# Patient Record
Sex: Male | Born: 1965 | Race: White | Hispanic: No | Marital: Single | Smoking: Never smoker
Health system: Southern US, Community
[De-identification: ages and names within clinical notes are randomized; demographics above are authoritative.]

## PROBLEM LIST (undated history)

## (undated) DIAGNOSIS — K219 Gastro-esophageal reflux disease without esophagitis: Secondary | ICD-10-CM

---

## 2016-07-23 ENCOUNTER — Emergency Department
Admission: EM | Admit: 2016-07-23 | Discharge: 2016-07-23 | Disposition: A | Discharge status: Home / Self Care | Payer: BLUE CROSS/BLUE SHIELD | Source: Home / Self Care | Attending: Family Medicine | Admitting: Family Medicine

## 2016-07-23 ENCOUNTER — Encounter: Payer: Self-pay | Admitting: Emergency Medicine

## 2016-07-23 DIAGNOSIS — B9789 Other viral agents as the cause of diseases classified elsewhere: Secondary | ICD-10-CM | POA: Diagnosis not present

## 2016-07-23 DIAGNOSIS — J069 Acute upper respiratory infection, unspecified: Secondary | ICD-10-CM

## 2016-07-23 MED ORDER — GUAIFENESIN-CODEINE 100-10 MG/5ML PO SOLN: ORAL | 0 refills | Status: DC

## 2016-07-23 MED ORDER — AZITHROMYCIN 250 MG PO TABS: ORAL_TABLET | ORAL | 0 refills | Status: DC

## 2016-07-23 NOTE — ED Triage Notes (Signed)
Pt c/o cough and ear fullness that started x2 days ago. Denies fever. He is taking mucinex.

## 2016-07-23 NOTE — Discharge Instructions (Signed)
Take plain guaifenesin (1200mg extended release tabs such as Mucinex) twice daily, with plenty of water, for cough and congestion.  May add Pseudoephedrine (30mg, one or two every 4 to 6 hours) for sinus congestion.  Get adequate rest.   °May use Afrin nasal spray (or generic oxymetazoline) twice daily for about 5 days and then discontinue.  Also recommend using saline nasal spray several times daily and saline nasal irrigation (AYR is a common brand).  Use Flonase nasal spray each morning after using Afrin nasal spray and saline nasal irrigation. °Try warm salt water gargles for sore throat.  °Stop all antihistamines for now, and other non-prescription cough/cold preparations. °May take Ibuprofen 200mg, 4 tabs every 8 hours with food for body aches, headache, etc. °Begin Azithromycin if not improving about one week or if persistent fever develops  °Follow-up with family doctor if not improving about10 days.  °

## 2016-07-23 NOTE — ED Provider Notes (Signed)
[facility]    CSN: [id number] Arrival date & time: 07/23/16  1615     History   Chief Complaint Chief Complaint  Patient presents with  . Cough    HPI Isaac Little is a 51 y.o. male.   Patient complains of three day history of typical cold-like symptoms including mild sore throat, sinus congestion, fatigue, and cough.    The history is provided by the patient.    History reviewed. No pertinent past medical history.  There are no active problems to display for this patient.   History reviewed. No pertinent surgical history.     Home Medications    Prior to Admission medications   Medication Sig Start Date End Date Taking? Authorizing Provider  azithromycin (ZITHROMAX Z-PAK) 250 MG tablet Take 2 tabs today; then begin one tab once daily for 4 more days (Rx void after 07/31/16) 07/23/16   Lattie HawStephen A Beese, MD  guaiFENesin-codeine 100-10 MG/5ML syrup Take 10mL by mouth at bedtime as needed for cough 07/23/16   Lattie HawStephen A Beese, MD    Family History History reviewed. No pertinent family history.  Social History Social History  Substance Use Topics  . Smoking status: Never Smoker  . Smokeless tobacco: Never Used  . Alcohol use No     Allergies   Patient has no allergy information on record.   Review of Systems Review of Systems + sore throat + cough No pleuritic pain No wheezing + nasal congestion + post-nasal drainage No sinus pain/pressure No itchy/red eyes ? earache No hemoptysis No SOB No fever, + chills No nausea No vomiting No abdominal pain No diarrhea No urinary symptoms No skin rash + fatigue No myalgias No headache Used OTC meds without relief   Physical Exam Triage Vital Signs ED Triage Vitals  Enc Vitals Group     BP 07/23/16 1639 119/79     Pulse Rate 07/23/16 1639 76     Resp --      Temp 07/23/16 1639 98.5 F (36.9 C)     Temp Source 07/23/16 1639 Oral     SpO2 07/23/16 1639 99 %     Weight 07/23/16  1640 207 lb (93.9 kg)     Height --      Head Circumference --      Peak Flow --      Pain Score 07/23/16 1641 0     Pain Loc --      Pain Edu? --      Excl. in GC? --    No data found.   Updated Vital Signs BP 119/79 (BP Location: Right Arm)   Pulse 76   Temp 98.5 F (36.9 C) (Oral)   Wt 207 lb (93.9 kg)   SpO2 99%   Visual Acuity Right Eye Distance:   Left Eye Distance:   Bilateral Distance:    Right Eye Near:   Left Eye Near:    Bilateral Near:     Physical Exam Nursing notes and Vital Signs reviewed. Appearance:  Patient appears stated age, and in no acute distress Eyes:  Pupils are equal, round, and reactive to light and accomodation.  Extraocular movement is intact.  Conjunctivae are not inflamed  Ears:  Canals normal.  Tympanic membranes normal.  Nose:  Mildly congested turbinates.  No sinus tenderness.   Pharynx:  Normal Neck:  Supple.  Tender enlarged posterior/lateral nodes are palpated bilaterally  Lungs:  Clear to auscultation.  Breath sounds are equal.  Moving air  well. Heart:  Regular rate and rhythm without murmurs, rubs, or gallops.  Abdomen:  Nontender without masses or hepatosplenomegaly.  Bowel sounds are present.  No CVA or flank tenderness.  Extremities:  No edema.  Skin:  No rash present.    UC Treatments / Results  Labs (all labs ordered are listed, but only abnormal results are displayed) Labs Reviewed - No data to display  EKG  EKG Interpretation None       Radiology No results found.  Procedures Procedures (including critical care time)  Medications Ordered in UC Medications - No data to display   Initial Impression / Assessment and Plan / UC Course  I have reviewed the triage vital signs and the nursing notes.  Pertinent labs & imaging results that were available during my care of the patient were reviewed by me and considered in my medical decision making (see chart for details).    There is no evidence of bacterial  infection today.   Treat symptomatically for now  Rx for Robitussin AC for night time cough.  Take plain guaifenesin (1200mg  extended release tabs such as Mucinex) twice daily, with plenty of water, for cough and congestion.  May add Pseudoephedrine (30mg , one or two every 4 to 6 hours) for sinus congestion.  Get adequate rest.   May use Afrin nasal spray (or generic oxymetazoline) twice daily for about 5 days and then discontinue.  Also recommend using saline nasal spray several times daily and saline nasal irrigation (AYR is a common brand).  Use Flonase nasal spray each morning after using Afrin nasal spray and saline nasal irrigation. Try warm salt water gargles for sore throat.  Stop all antihistamines for now, and other non-prescription cough/cold preparations. May take Ibuprofen 200mg , 4 tabs every 8 hours with food for body aches, headache, etc. Begin Azithromycin if not improving about one week or if persistent fever develops (Given a prescription to hold, with an expiration date)  Follow-up with family doctor if not improving about10 days.     Final Clinical Impressions(s) / UC Diagnoses   Final diagnoses:  Viral URI with cough    New Prescriptions New Prescriptions   AZITHROMYCIN (ZITHROMAX Z-PAK) 250 MG TABLET    Take 2 tabs today; then begin one tab once daily for 4 more days (Rx void after 07/31/16)   GUAIFENESIN-CODEINE 100-10 MG/5ML SYRUP    Take 10mL by mouth at bedtime as needed for cough     Lattie Haw, MD 07/23/16 [phone]

## 2018-04-28 ENCOUNTER — Encounter: Payer: Self-pay | Admitting: Emergency Medicine

## 2018-04-28 ENCOUNTER — Emergency Department
Admission: EM | Admit: 2018-04-28 | Discharge: 2018-04-28 | Disposition: A | Discharge status: Home / Self Care | Payer: BLUE CROSS/BLUE SHIELD | Source: Home / Self Care | Attending: Family Medicine | Admitting: Family Medicine

## 2018-04-28 DIAGNOSIS — J209 Acute bronchitis, unspecified: Secondary | ICD-10-CM | POA: Diagnosis not present

## 2018-04-28 MED ORDER — AZITHROMYCIN 250 MG PO TABS: ORAL_TABLET | ORAL | 0 refills | Status: DC

## 2018-04-28 NOTE — Discharge Instructions (Addendum)
Take plain guaifenesin (1200mg extended release tabs such as Mucinex) twice daily, with plenty of water, for cough and congestion.  May add Pseudoephedrine (30mg, one or two every 4 to 6 hours) for sinus congestion.  Get adequate rest.   °May use Afrin nasal spray (or generic oxymetazoline) each morning for about 5 days and then discontinue.  Also recommend using saline nasal spray several times daily and saline nasal irrigation (AYR is a common brand).  Use Flonase nasal spray each morning after using Afrin nasal spray and saline nasal irrigation. °Try warm salt water gargles for sore throat.  °Stop all antihistamines for now, and other non-prescription cough/cold preparations. °May take Ibuprofen 200mg, 4 tabs every 8 hours with food for body aches, fever, etc. °May take Delsym Cough Suppressant at bedtime for nighttime cough.  °

## 2018-04-28 NOTE — ED Triage Notes (Signed)
Pt c/o cough and chest congestion x1 week. Taking mucinex.

## 2018-04-28 NOTE — ED Provider Notes (Signed)
[facility]    CSN: [id number] Arrival date & time: 04/28/18  1344     History   Chief Complaint Chief Complaint  Patient presents with  . Cough    HPI Isaac Cookeyimothy Barua is a 52 y.o. male.   About 2 weeks ago patient developed typical cold-like symptoms developing over several days, including mild sore throat, sinus congestion, fatigue, and cough.  His cough has persisted and he now feels tight in his anterior chest  The history is provided by the patient.    History reviewed. No pertinent past medical history.  There are no active problems to display for this patient.   History reviewed. No pertinent surgical history.     Home Medications    Prior to Admission medications   Medication Sig Start Date End Date Taking? Authorizing Provider  azithromycin (ZITHROMAX Z-PAK) 250 MG tablet Take 2 tabs today; then begin one tab once daily for 4 more days (Rx void after 07/31/16) 04/28/18   Lattie HawBeese, Soley Harriss A, MD  guaiFENesin-codeine 100-10 MG/5ML syrup Take 10mL by mouth at bedtime as needed for cough 07/23/16   Lattie HawBeese, Dawsyn Zurn A, MD    Family History History reviewed. No pertinent family history.  Social History Social History   Tobacco Use  . Smoking status: Never Smoker  . Smokeless tobacco: Never Used  Substance Use Topics  . Alcohol use: No  . Drug use: Not on file     Allergies   Patient has no allergy information on record.   Review of Systems Review of Systems + sore throat + cough No pleuritic pain, but feels tight in anterior chest No wheezing + nasal congestion + post-nasal drainage No sinus pain/pressure No itchy/red eyes ? earache No hemoptysis + SOB No fever, + chills No nausea No vomiting No abdominal pain No diarrhea No urinary symptoms No skin rash + fatigue No myalgias No headache Used OTC meds without relief   Physical Exam Triage Vital Signs ED Triage Vitals  Enc Vitals Group     BP 04/28/18 1409 132/82   Pulse Rate 04/28/18 1409 70     Resp --      Temp 04/28/18 1409 97.6 F (36.4 C)     Temp Source 04/28/18 1409 Oral     SpO2 04/28/18 1409 98 %     Weight 04/28/18 1410 215 lb (97.5 kg)     Height --      Head Circumference --      Peak Flow --      Pain Score 04/28/18 1410 0     Pain Loc --      Pain Edu? --      Excl. in GC? --    No data found.  Updated Vital Signs BP 132/82 (BP Location: Right Arm)   Pulse 70   Temp 97.6 F (36.4 C) (Oral)   Wt 97.5 kg   SpO2 98%   Visual Acuity Right Eye Distance:   Left Eye Distance:   Bilateral Distance:    Right Eye Near:   Left Eye Near:    Bilateral Near:     Physical Exam Nursing notes and Vital Signs reviewed. Appearance:  Patient appears stated age, and in no acute distress Eyes:  Pupils are equal, round, and reactive to light and accomodation.  Extraocular movement is intact.  Conjunctivae are not inflamed  Ears:  Canals normal.  Tympanic membranes normal.  Nose:  Congested turbinates.  No sinus tenderness.   Pharynx:  Normal  Neck:  Supple.  Enlarged nontender posterior/lateral nodes are palpated bilaterally.  Lungs:  Clear to auscultation.  Breath sounds are equal.  Moving air well. Heart:  Regular rate and rhythm without murmurs, rubs, or gallops.  Abdomen:  Nontender without masses or hepatosplenomegaly.  Bowel sounds are present.  No CVA or flank tenderness.  Extremities:  No edema.  Skin:  No rash present.    UC Treatments / Results  Labs (all labs ordered are listed, but only abnormal results are displayed) Labs Reviewed - No data to display  EKG None  Radiology No results found.  Procedures Procedures (including critical care time)  Medications Ordered in UC Medications - No data to display  Initial Impression / Assessment and Plan / UC Course  I have reviewed the triage vital signs and the nursing notes.  Pertinent labs & imaging results that were available during my care of the patient were  reviewed by me and considered in my medical decision making (see chart for details).    Begin Z-pak. Followup with Family Doctor if not improved in one week.    Final Clinical Impressions(s) / UC Diagnoses   Final diagnoses:  Acute bronchitis, unspecified organism     Discharge Instructions     Take plain guaifenesin (1200mg  extended release tabs such as Mucinex) twice daily, with plenty of water, for cough and congestion.  May add Pseudoephedrine (30mg , one or two every 4 to 6 hours) for sinus congestion.  Get adequate rest.   May use Afrin nasal spray (or generic oxymetazoline) each morning for about 5 days and then discontinue.  Also recommend using saline nasal spray several times daily and saline nasal irrigation (AYR is a common brand).  Use Flonase nasal spray each morning after using Afrin nasal spray and saline nasal irrigation. Try warm salt water gargles for sore throat.  Stop all antihistamines for now, and other non-prescription cough/cold preparations. May take Ibuprofen 200mg , 4 tabs every 8 hours with food for body aches, fever, etc. May take Delsym Cough Suppressant at bedtime for nighttime cough.           ED Prescriptions    Medication Sig Dispense Auth. Provider   azithromycin (ZITHROMAX Z-PAK) 250 MG tablet Take 2 tabs today; then begin one tab once daily for 4 more days (Rx void after 07/31/16) 6 tablet Lattie Haw, MD        Lattie Haw, MD 05/01/18 1950

## 2018-05-17 ENCOUNTER — Emergency Department
Admission: EM | Admit: 2018-05-17 | Discharge: 2018-05-17 | Disposition: A | Discharge status: Home / Self Care | Payer: BLUE CROSS/BLUE SHIELD | Source: Home / Self Care | Attending: Emergency Medicine | Admitting: Emergency Medicine

## 2018-05-17 ENCOUNTER — Other Ambulatory Visit: Payer: Self-pay

## 2018-05-17 ENCOUNTER — Encounter: Payer: Self-pay | Admitting: *Deleted

## 2018-05-17 DIAGNOSIS — J209 Acute bronchitis, unspecified: Secondary | ICD-10-CM

## 2018-05-17 DIAGNOSIS — R059 Cough, unspecified: Secondary | ICD-10-CM

## 2018-05-17 DIAGNOSIS — R05 Cough: Secondary | ICD-10-CM

## 2018-05-17 MED ORDER — METHYLPREDNISOLONE ACETATE 80 MG/ML IJ SUSP
80.0000 mg | Freq: Once | INTRAMUSCULAR | Status: AC
  Administered 2018-05-17: 80 mg via INTRAMUSCULAR

## 2018-05-17 MED ORDER — PREDNISONE 20 MG PO TABS: 60.0000 mg | ORAL_TABLET | Freq: Every day | ORAL | 0 refills | Status: AC

## 2018-05-17 NOTE — ED Provider Notes (Signed)
[facility]    CSN: [id number] Arrival date & time: 05/17/18  1120     History   Chief Complaint Chief Complaint  Patient presents with  . Cough    HPI Charvis Lightner is a 52 y.o. male.   HPI Onset about 3 weeks ago of cough productive of discolored sputum and fever, was treated with Z-Pak 2 weeks ago and he felt as if he was feeling better without any fever or discoloration of sputum, but the cough persisted.  Then, for the past 2 or 3 nights he has had worse hacking, nonproductive cough without chest pain or shortness of breath or wheezing.  No fever.  No history of asthma or chronic lung disease.  He does not smoke, but he was exposed to some secondhand smoke over Thanksgiving. History reviewed. No pertinent past medical history.  There are no active problems to display for this patient.   History reviewed. No pertinent surgical history.     Home Medications    Prior to Admission medications   Medication Sig Start Date End Date Taking? Authorizing Provider  predniSONE (DELTASONE) 20 MG tablet Take 3 tablets (60 mg total) by mouth daily for 5 days. 05/17/18 05/22/18  Lajean Manes, MD    Family History History reviewed. No pertinent family history.  Social History Social History   Tobacco Use  . Smoking status: Never Smoker  . Smokeless tobacco: Never Used  Substance Use Topics  . Alcohol use: No  . Drug use: Not on file     Allergies   Patient has no known allergies.   Review of Systems Review of Systems  All other systems reviewed and are negative.    Physical Exam Triage Vital Signs ED Triage Vitals  Enc Vitals Group     BP 05/17/18 1141 116/79     Pulse Rate 05/17/18 1141 75     Resp 05/17/18 1141 18     Temp 05/17/18 1141 98.4 F (36.9 C)     Temp Source 05/17/18 1141 Oral     SpO2 05/17/18 1141 99 %     Weight 05/17/18 1142 220 lb (99.8 kg)     Height 05/17/18 1142 5\' 11"  (1.803 m)     Head Circumference --      Peak  Flow --      Pain Score 05/17/18 1142 0     Pain Loc --      Pain Edu? --      Excl. in GC? --    No data found.  Updated Vital Signs BP 116/79 (BP Location: Right Arm)   Pulse 75   Temp 98.4 F (36.9 C) (Oral)   Resp 18   Ht 5\' 11"  (1.803 m)   Wt 99.8 kg   SpO2 99%   BMI 30.68 kg/m   Visual Acuity Right Eye Distance:   Left Eye Distance:   Bilateral Distance:    Right Eye Near:   Left Eye Near:    Bilateral Near:     Physical Exam  Constitutional: He is oriented to person, place, and time. He appears well-developed and well-nourished. No distress.  Severe hacking cough noted at times  HENT:  Head: Normocephalic and atraumatic.  Right Ear: Tympanic membrane normal.  Left Ear: Tympanic membrane normal.  Nose: Nose normal.  Mouth/Throat: Oropharynx is clear and moist. No oropharyngeal exudate.  Eyes: Right eye exhibits no discharge. Left eye exhibits no discharge. No scleral icterus.  Neck: Neck supple.  Cardiovascular: Normal  rate, regular rhythm and normal heart sounds.  Pulmonary/Chest: No respiratory distress. He has no wheezes. He has rhonchi (Mild, diffuse, clears after coughing). He has no rales.  Other than scattered rhonchi, his lungs are clear.   Lymphadenopathy:    He has no cervical adenopathy.  Neurological: He is alert and oriented to person, place, and time.  Skin: Skin is warm and dry.  Nursing note and vitals reviewed.    UC Treatments / Results  Labs (all labs ordered are listed, but only abnormal results are displayed) Labs Reviewed - No data to display  EKG None  Radiology No results found.  Procedures Procedures (including critical care time)  Medications Ordered in UC Medications  methylPREDNISolone acetate (DEPO-MEDROL) injection 80 mg (80 mg Intramuscular Given 05/17/18 1206)    Initial Impression / Assessment and Plan / UC Course  I have reviewed the triage vital signs and the nursing notes.  Pertinent labs & imaging  results that were available during my care of the patient were reviewed by me and considered in my medical decision making (see chart for details).     Likely has post bronchitic cough.  Was treated for bacterial bronchitis 2 weeks ago and bacterial bronchitis symptoms resolved, but his residual symptom is severe cough. Clinically, no evidence of wheezing and no indication for chest x-ray as his lungs are otherwise clear other than few scattered rhonchi. After treatment options discussed he agrees with the following plans as described in the discharge instructions. Depo-Medrol 80 mg IM stat. Oral prednisone burst. Other symptomatic care discussed.  He declined any prescription cough med Final Clinical Impressions(s) / UC Diagnoses   Final diagnoses:  Cough  Acute bronchitis     Discharge Instructions     Your cough is likely from leftover inflammation from the bronchitis that started 2 weeks ago.  You have already been treated with antibiotic, and there is no sign of any bacterial infection at this time, so no antibiotic indicated. Listening to your lungs, there is no sign of pneumonia, so chest x-ray not needed. Because the cough has been severe, were aggressively treating the inflammation with Depo-Medrol 80 mg IM shot, and I have sent a prescription for prednisone to your pharmacy to take for 5 days. Follow-up with your PCP if not improved in 7 days, but return to urgent care or emergency room if any severe worsening symptoms.     ED Prescriptions    Medication Sig Dispense Auth. Provider   predniSONE (DELTASONE) 20 MG tablet Take 3 tablets (60 mg total) by mouth daily for 5 days. 15 tablet Lajean ManesMassey, David, MD     Controlled Substance Prescriptions [place] Controlled Substance Registry consulted? Not Applicable   Lajean ManesMassey, David, MD 05/17/18 1217

## 2018-05-17 NOTE — ED Triage Notes (Signed)
Pt c/o cough x 3-4 wks. He took Zpak and mucinex, felt some better then cough began worse again. Denies fever.

## 2018-05-17 NOTE — Discharge Instructions (Addendum)
Your cough is likely from leftover inflammation from the bronchitis that started 2 weeks ago.  You have already been treated with antibiotic, and there is no sign of any bacterial infection at this time, so no antibiotic indicated. Listening to your lungs, there is no sign of pneumonia, so chest x-ray not needed. Because the cough has been severe, were aggressively treating the inflammation with Depo-Medrol 80 mg IM shot, and I have sent a prescription for prednisone to your pharmacy to take for 5 days. Follow-up with your PCP if not improved in 7 days, but return to urgent care or emergency room if any severe worsening symptoms.

## 2018-10-07 ENCOUNTER — Emergency Department
Admission: EM | Admit: 2018-10-07 | Discharge: 2018-10-07 | Disposition: A | Discharge status: Home / Self Care | Payer: BLUE CROSS/BLUE SHIELD | Source: Home / Self Care

## 2018-10-07 ENCOUNTER — Other Ambulatory Visit: Payer: Self-pay

## 2018-10-07 DIAGNOSIS — J309 Allergic rhinitis, unspecified: Secondary | ICD-10-CM | POA: Diagnosis not present

## 2018-10-07 DIAGNOSIS — J019 Acute sinusitis, unspecified: Secondary | ICD-10-CM

## 2018-10-07 MED ORDER — FLUTICASONE PROPIONATE 50 MCG/ACT NA SUSP: 2.0000 | Freq: Every day | NASAL | 1 refills | Status: DC

## 2018-10-07 MED ORDER — AMOXICILLIN-POT CLAVULANATE 875-125 MG PO TABS: 1.0000 | ORAL_TABLET | Freq: Two times a day (BID) | ORAL | 0 refills | Status: DC

## 2018-10-07 NOTE — Discharge Instructions (Addendum)
Take over-the-counter Zyrtec-D (cetirizine D) or Claritin daily (loratadine D) daily for antihistamine decongestion  Continue using your Flonase 2 sprays each nostril twice daily for about 3 days, then cut back to once daily  Drink lots of fluids  Monitor your temperature twice daily.  If you are getting worse with more thick yellow or green drainage from your nose and/or back of your throat and are getting worse rather than better by Sunday or so, then go ahead and get the prescription for antibiotics filled.  Amoxicillin 500 mg 2 pills twice daily.  If you are doing better and do not need the antibiotics, do not get it filled.  It is better not to take excessive antibiotics when we do not need them.  I believe this is a sinusitis brought on by allergies or possibly a virus.  If however you start running fevers or develop cough and shortness of breath I recommend you then go to [facility] or some other facility that is able to check you for COVID, but it seems unlikely at this time.

## 2018-10-07 NOTE — ED Triage Notes (Signed)
Pt c/o sinus pressure/congestion since Sunday. Taking flonase prn. Denies fever. Mentions loss of taste?

## 2018-10-07 NOTE — ED Provider Notes (Signed)
[facility]    CSN: [id number] Arrival date & time: 10/07/18  1541     History   Chief Complaint Chief Complaint  Patient presents with  . Facial Pain    Pressure in sinuses    HPI Arbie Cookeyimothy Lapierre is a 53 y.o. male.   HPI Patient has a history of having sinus problems intermittently in the past.  He has some Flonase at home from before.  He has been having problems with nasal congestion this week.  He has some facial pain.  He was so stuffy could not taste things.  He has some thick postnasal drainage.  Minimal cough from that but otherwise chest is been fine.  No fevers and they have been checking his temperature at home.  Had a little discomfort in his right ear.  He has used a little bit of Flonase and some Afrin, not very consistently. History reviewed. No pertinent past medical history.  There are no active problems to display for this patient.   History reviewed. No pertinent surgical history.     Home Medications    Prior to Admission medications   Medication Sig Start Date End Date Taking? Authorizing Provider  amoxicillin-clavulanate (AUGMENTIN) 875-125 MG tablet Take 1 tablet by mouth every 12 (twelve) hours. 10/07/18   Peyton NajjarHopper, David H, MD  fluticasone (FLONASE) 50 MCG/ACT nasal spray Place 2 sprays into both nostrils daily. 10/07/18   Peyton NajjarHopper, David H, MD    Family History History reviewed. No pertinent family history.  Social History Social History   Tobacco Use  . Smoking status: Never Smoker  . Smokeless tobacco: Never Used  Substance Use Topics  . Alcohol use: No  . Drug use: Not on file     Allergies   Patient has no known allergies.   Review of Systems Review of Systems Constitutional: No fever.  No generalized malaise HEENT: As above Respiratory: Unremarkable GI: Unremarkable Musculature: Unremarkable   Physical Exam Triage Vital Signs ED Triage Vitals  Enc Vitals Group     BP 10/07/18 1605 135/84     Pulse Rate  10/07/18 1605 77     Resp 10/07/18 1605 16     Temp 10/07/18 1605 98.6 F (37 C)     Temp Source 10/07/18 1605 Oral     SpO2 10/07/18 1605 99 %     Weight 10/07/18 1606 220 lb 7.4 oz (100 kg)     Height 10/07/18 1606 5\' 11"  (1.803 m)     Head Circumference --      Peak Flow --      Pain Score 10/07/18 1606 0     Pain Loc --      Pain Edu? --      Excl. in GC? --    No data found.  Updated Vital Signs BP 135/84 (BP Location: Right Arm)   Pulse 77   Temp 98.6 F (37 C) (Oral)   Resp 16   Ht 5\' 11"  (1.803 m)   Wt 100 kg   SpO2 99%   BMI 30.75 kg/m   Visual Acuity Right Eye Distance:   Left Eye Distance:   Bilateral Distance:    Right Eye Near:   Left Eye Near:    Bilateral Near:     Physical Exam Healthy-appearing man in no acute distress.  TMs normal.  Throat not erythematous.  Mild sinus tenderness more on the right than the left.  Neck supple without significant nodes.  Did not do a  chest exam to avoid contamination since he really did not have any symptoms.  O2 saturation is good.  UC Treatments / Results  Labs (all labs ordered are listed, but only abnormal results are displayed) Labs Reviewed - No data to display  EKG None  Radiology No results found.  Procedures Procedures (including critical care time)  Medications Ordered in UC Medications - No data to display  Initial Impression / Assessment and Plan / UC Course  I have reviewed the triage vital signs and the nursing notes.  Pertinent labs & imaging results that were available during my care of the patient were reviewed by me and considered in my medical decision making (see chart for details).     Allergic sinusitis/acute rhinosinusitis Final Clinical Impressions(s) / UC Diagnoses   Final diagnoses:  Allergic sinusitis  Acute rhinosinusitis     Discharge Instructions     Take over-the-counter Zyrtec-D (cetirizine D) or Claritin daily (loratadine D) daily for antihistamine  decongestion  Continue using your Flonase 2 sprays each nostril twice daily for about 3 days, then cut back to once daily  Drink lots of fluids  Monitor your temperature twice daily.  If you are getting worse with more thick yellow or green drainage from your nose and/or back of your throat and are getting worse rather than better by Sunday or so, then go ahead and get the prescription for antibiotics filled.  Amoxicillin 500 mg 2 pills twice daily.  If you are doing better and do not need the antibiotics, do not get it filled.  It is better not to take excessive antibiotics when we do not need them.  I believe this is a sinusitis brought on by allergies or possibly a virus.  If however you start running fevers or develop cough and shortness of breath I recommend you then go to [place] [facility] or some other facility that is able to check you for COVID, but it seems unlikely at this time.   I called him back to say only to take the Augmentin one twice daily since I had changed and rx's augmentin rather than amoxicillin. ED Prescriptions    Medication Sig Dispense Auth. Provider   amoxicillin-clavulanate (AUGMENTIN) 875-125 MG tablet Take 1 tablet by mouth every 12 (twelve) hours. 14 tablet Peyton Najjar, MD   fluticasone [facility]) 50 MCG/ACT nasal spray Place 2 sprays into both nostrils daily. 16 g Peyton Najjar, MD     Controlled Substance Prescriptions [place] Controlled Substance Registry consulted? No   Peyton Najjar, MD 10/07/18 [phone]

## 2018-10-15 ENCOUNTER — Telehealth: Payer: Self-pay

## 2018-10-15 MED ORDER — AMOXICILLIN-POT CLAVULANATE 875-125 MG PO TABS: 1.0000 | ORAL_TABLET | Freq: Two times a day (BID) | ORAL | 0 refills | Status: DC

## 2018-10-15 NOTE — Telephone Encounter (Signed)
Pt called in requesting Augmentin to be filled. Was given written script at [facility] but has misplaced it. Developed cough and wishing to fill script today. Sent e-script to [facility].

## 2018-11-08 ENCOUNTER — Other Ambulatory Visit: Payer: Self-pay

## 2018-11-08 ENCOUNTER — Encounter: Payer: Self-pay | Admitting: *Deleted

## 2018-11-08 ENCOUNTER — Emergency Department
Admission: EM | Admit: 2018-11-08 | Discharge: 2018-11-08 | Disposition: A | Discharge status: Home / Self Care | Payer: BLUE CROSS/BLUE SHIELD | Source: Home / Self Care

## 2018-11-08 DIAGNOSIS — K219 Gastro-esophageal reflux disease without esophagitis: Secondary | ICD-10-CM

## 2018-11-08 DIAGNOSIS — K1370 Unspecified lesions of oral mucosa: Secondary | ICD-10-CM | POA: Diagnosis not present

## 2018-11-08 MED ORDER — MAGIC MOUTHWASH: 5.0000 mL | Freq: Three times a day (TID) | ORAL | 0 refills | Status: DC | PRN

## 2018-11-08 MED ORDER — OMEPRAZOLE 20 MG PO CPDR: 20.0000 mg | DELAYED_RELEASE_CAPSULE | Freq: Every day | ORAL | 0 refills | Status: DC

## 2018-11-08 NOTE — ED Triage Notes (Signed)
Pt c/o a spot on his tongue that he noticed yesterday. Denies pain or injury.

## 2018-11-08 NOTE — ED Provider Notes (Signed)
[facility]    CSN: [id number] Arrival date & time: 11/08/18  1547     History   Chief Complaint Chief Complaint  Patient presents with   Mouth Lesions    HPI Arbie Cookeyimothy Pesta is a 53 y.o. male.   HPI  Arbie Cookeyimothy Shankel is a 53 y.o. male presenting to UC with c/o a bare looking spot on his tongue that he noticed yesterday after cleaning something out of his teeth.  He denies pain or itching to the area. He notes he has been vomiting somewhat frequently recently due to acid reflux from a hiatal hernia that was diagnosed in the emergency department a few years ago.  He is not on any acid reflux medication.  He saw his dentist about 1 year ago and does not recall the dentist mentioning any abnormal lesions.  He has never smoked or chewed tobacco. No new OTC medications or supplements.    History reviewed. No pertinent past medical history.  There are no active problems to display for this patient.   History reviewed. No pertinent surgical history.     Home Medications    Prior to Admission medications   Medication Sig Start Date End Date Taking? Authorizing Provider  magic mouthwash SOLN Take 5-10 mLs by mouth 3 (three) times daily as needed for mouth pain. 11/08/18   Lurene ShadowPhelps, Vence Lalor O, PA-C  omeprazole (PRILOSEC) 20 MG capsule Take 1 capsule (20 mg total) by mouth daily. 11/08/18   Lurene ShadowPhelps, Blaike Newburn O, PA-C    Family History Family History  Problem Relation Age of Onset   Heart disease Father     Social History Social History   Tobacco Use   Smoking status: Never Smoker   Smokeless tobacco: Never Used  Substance Use Topics   Alcohol use: No   Drug use: Never     Allergies   Patient has no known allergies.   Review of Systems Review of Systems  Constitutional: Negative for chills and fever.  HENT: Positive for mouth sores. Negative for congestion, postnasal drip and sore throat.   Gastrointestinal: Positive for vomiting ( from acid reflux).  Negative for abdominal pain, diarrhea and nausea.     Physical Exam Triage Vital Signs ED Triage Vitals  Enc Vitals Group     BP 11/08/18 1654 127/77     Pulse Rate 11/08/18 1654 60     Resp 11/08/18 1654 18     Temp 11/08/18 1654 (!) 97.5 F (36.4 C)     Temp Source 11/08/18 1654 Oral     SpO2 11/08/18 1654 100 %     Weight 11/08/18 1655 219 lb (99.3 kg)     Height 11/08/18 1655 5\' 11"  (1.803 m)     Head Circumference --      Peak Flow --      Pain Score 11/08/18 1655 0     Pain Loc --      Pain Edu? --      Excl. in GC? --    No data found.  Updated Vital Signs BP 127/77 (BP Location: Right Arm)    Pulse 60    Temp (!) 97.5 F (36.4 C) (Oral)    Resp 18    Ht 5\' 11"  (1.803 m)    Wt 219 lb (99.3 kg)    SpO2 100%    BMI 30.54 kg/m   Visual Acuity Right Eye Distance:   Left Eye Distance:   Bilateral Distance:    Right Eye Near:  Left Eye Near:    Bilateral Near:     Physical Exam Vitals signs and nursing note reviewed.  Constitutional:      Appearance: Normal appearance. He is well-developed.  HENT:     Head: Normocephalic and atraumatic.     Nose: Nose normal.     Mouth/Throat:     Lips: Pink.     Mouth: Mucous membranes are moist.     Dentition: No gum lesions.     Tongue: Lesions ( 2cm pink oval clearing on his whitish tongue. ) present.     Pharynx: Oropharynx is clear. Uvula midline. No posterior oropharyngeal erythema.      Comments: No oral masses. No bleeding or drainage.  Neck:     Musculoskeletal: Normal range of motion.  Cardiovascular:     Rate and Rhythm: Normal rate.  Pulmonary:     Effort: Pulmonary effort is normal.  Musculoskeletal: Normal range of motion.  Skin:    General: Skin is warm and dry.  Neurological:     Mental Status: He is alert and oriented to person, place, and time.  Psychiatric:        Behavior: Behavior normal.      UC Treatments / Results  Labs (all labs ordered are listed, but only abnormal results are  displayed) Labs Reviewed - No data to display  EKG None  Radiology No results found.  Procedures Procedures (including critical care time)  Medications Ordered in UC Medications - No data to display  Initial Impression / Assessment and Plan / UC Course  I have reviewed the triage vital signs and the nursing notes.  Pertinent labs & imaging results that were available during my care of the patient were reviewed by me and considered in my medical decision making (see chart for details).     Lesion looks similar to a superficial burn on his tongue. Pt denies recent mouth burns from food or drink No masses noted in mouth. Lesion on tongue could be from recent vomiting episodes due to severe acid reflux Will have pt try magic mouthwash as well as start on Prilosec  Encouraged f/u with PCP and dentist.   Final Clinical Impressions(s) / UC Diagnoses   Final diagnoses:  Oral lesion  Gastroesophageal reflux disease, esophagitis presence not specified     Discharge Instructions      Please use resource guide to establish care with a primary care provider and dentist for further evaluation and treatment of your acid reflux and the spot on your tongue.  It is recommended that you follow up with a family medicine provider at least once a year and with a dentist every six months.     ED Prescriptions    Medication Sig Dispense Auth. Provider   magic mouthwash SOLN  (Status: Discontinued) Take 5-10 mLs by mouth 3 (three) times daily as needed for mouth pain. 100 mL Doroteo Glassman, Rose-Marie Hickling O, PA-C   omeprazole (PRILOSEC) 20 MG capsule Take 1 capsule (20 mg total) by mouth daily. 30 capsule Waylan Rocher O, PA-C   magic mouthwash SOLN Take 5-10 mLs by mouth 3 (three) times daily as needed for mouth pain. 100 mL Lurene Shadow, PA-C     Controlled Substance Prescriptions [place] Controlled Substance Registry consulted? Not Applicable   Rolla Plate 11/09/18 1131

## 2018-11-08 NOTE — Discharge Instructions (Signed)
°  Please use resource guide to establish care with a primary care provider and dentist for further evaluation and treatment of your acid reflux and the spot on your tongue.  It is recommended that you follow up with a family medicine provider at least once a year and with a dentist every six months.

## 2019-05-02 ENCOUNTER — Emergency Department (INDEPENDENT_AMBULATORY_CARE_PROVIDER_SITE_OTHER)
Admission: EM | Admit: 2019-05-02 | Discharge: 2019-05-02 | Disposition: A | Discharge status: Home / Self Care | Payer: BLUE CROSS/BLUE SHIELD | Source: Home / Self Care | Attending: Family Medicine | Admitting: Family Medicine

## 2019-05-02 ENCOUNTER — Other Ambulatory Visit: Payer: Self-pay

## 2019-05-02 ENCOUNTER — Encounter: Payer: Self-pay | Admitting: Emergency Medicine

## 2019-05-02 DIAGNOSIS — R590 Localized enlarged lymph nodes: Secondary | ICD-10-CM

## 2019-05-02 DIAGNOSIS — B349 Viral infection, unspecified: Secondary | ICD-10-CM

## 2019-05-02 NOTE — Discharge Instructions (Addendum)
If cold-like symptoms develop, try the following: Take plain guaifenesin (1269m extended release tabs such as Mucinex) twice daily, with plenty of water, for cough and congestion.  May add Pseudoephedrine (360m one or two every 4 to 6 hours) for sinus congestion.  Get adequate rest.   May use Afrin nasal spray (or generic oxymetazoline) each morning for about 5 days and then discontinue.  Also recommend using saline nasal spray several times daily and saline nasal irrigation (AYR is a common brand).  Use Flonase nasal spray each morning after using Afrin nasal spray and saline nasal irrigation. Try warm salt water gargles for sore throat.  Stop all antihistamines for now, and other non-prescription cough/cold preparations. May take Ibuprofen 200109m4 tabs every 8 hours with food for body aches, headache, etc. May take Delsym Cough Suppressant at bedtime for nighttime cough.   If symptoms become significantly worse during the night or over the weekend, proceed to the local emergency room.   If COVID-19 test is positive, isolate yourself until the below conditions are met: 1)  At least 7 days since symptoms onset. AND 2)  > 72 hours after symptom resolution (absence of fever without the use of fever-reducing medicine, and improvement in respiratory symptoms.

## 2019-05-02 NOTE — ED Triage Notes (Signed)
Patient has noticed  Lymph nodes along anterior neck seem enlarged and tender since yesterday. No other symptoms. He has not had the influenza vacc this season. He has not had exposure to known covid, nor has he travelled.

## 2019-05-02 NOTE — ED Provider Notes (Signed)
[facility]    CSN: [id number] Arrival date & time: 05/02/19  1347      History   Chief Complaint Chief Complaint  Patient presents with  . Adenopathy    HPI Isaac Little is a 53 y.o. male.   Patient noticed mildly swollen/tender left lateral neck lymph nodes yesterday.  He has had slight nasal congestion but feels well otherwise.  He denies chest tightness, shortness of breath, and changes in taste/smell.  The history is provided by the patient.    History reviewed. No pertinent past medical history.  There are no active problems to display for this patient.   History reviewed. No pertinent surgical history.     Home Medications    Prior to Admission medications   Medication Sig Start Date End Date Taking? Authorizing Provider  magic mouthwash SOLN Take 5-10 mLs by mouth 3 (three) times daily as needed for mouth pain. 11/08/18   Phelps, Erin O, PA-C  omeprazole (PRILOSEC) 20 MG capsule Take 1 capsule (20 mg total) by mouth daily. 11/08/18   Phelps, Erin O, PA-C    Family History Family History  Problem Relation Age of Onset  . Heart disease Father     Social History Social History   Tobacco Use  . Smoking status: Never Smoker  . Smokeless tobacco: Never Used  Substance Use Topics  . Alcohol use: No  . Drug use: Never     Allergies   Patient has no known allergies.   Review of Systems Review of Systems No sore throat No cough No pleuritic pain No wheezing ? nasal congestion ? post-nasal drainage No sinus pain/pressure No itchy/red eyes No earache No hemoptysis No SOB No fever/chills No nausea No vomiting No abdominal pain No diarrhea No urinary symptoms No skin rash No fatigue No myalgias No headache    Physical Exam Triage Vital Signs ED Triage Vitals  Enc Vitals Group     BP 05/02/19 1449 (!) 142/82     Pulse Rate 05/02/19 1449 76     Resp 05/02/19 1449 16     Temp 05/02/19 1449 98.6 F (37 C)   Temp Source 05/02/19 1449 Oral     SpO2 05/02/19 1449 99 %     Weight 05/02/19 1450 218 lb 4.1 oz (99 kg)     Height 05/02/19 1450 5' 11" (1.803 m)     Head Circumference --      Peak Flow --      Pain Score 05/02/19 1450 0     Pain Loc --      Pain Edu? --      Excl. in GC? --    No data found.  Updated Vital Signs BP (!) 142/82 (BP Location: Right Arm)   Pulse 76   Temp 98.6 F (37 C) (Oral)   Resp 16   Ht 5' 11" (1.803 m)   Wt 99 kg   SpO2 99%   BMI 30.44 kg/m   Visual Acuity Right Eye Distance:   Left Eye Distance:   Bilateral Distance:    Right Eye Near:   Left Eye Near:    Bilateral Near:     Physical Exam Nursing notes and Vital Signs reviewed. Appearance:  Patient appears stated age, and in no acute distress Eyes:  Pupils are equal, round, and reactive to light and accomodation.  Extraocular movement is intact.  Conjunctivae are not inflamed  Ears:  Canals normal.  Tympanic membranes normal.  Nose:  Mildly   congested turbinates.  No sinus tenderness.   Pharynx:  Normal Neck:  Supple.  Enlarged posterior/lateral nodes are palpated bilaterally, tender to palpation on the left.   Lungs:  Clear to auscultation.  Breath sounds are equal.  Moving air well. Heart:  Regular rate and rhythm without murmurs, rubs, or gallops.  Abdomen:  Nontender without masses or hepatosplenomegaly.  Bowel sounds are present.  No CVA or flank tenderness.  Extremities:  No edema.  Skin:  No rash present.    UC Treatments / Results  Labs (all labs ordered are listed, but only abnormal results are displayed) Labs Reviewed  NOVEL CORONAVIRUS, NAA    EKG   Radiology No results found.  Procedures Procedures (including critical care time)  Medications Ordered in UC Medications - No data to display  Initial Impression / Assessment and Plan / UC Course  I have reviewed the triage vital signs and the nursing notes.  Pertinent labs & imaging results that were available during  my care of the patient were reviewed by me and considered in my medical decision making (see chart for details).    There is no evidence of bacterial infection today.  Treat symptomatically for now. COVID19 send out  Followup with Family Doctor if not improved in about 10 days.   Final Clinical Impressions(s) / UC Diagnoses   Final diagnoses:  Acute viral syndrome  Anterior cervical adenopathy     Discharge Instructions     If cold-like symptoms develop, try the following: Take plain guaifenesin (1200mg extended release tabs such as Mucinex) twice daily, with plenty of water, for cough and congestion.  May add Pseudoephedrine (30mg, one or two every 4 to 6 hours) for sinus congestion.  Get adequate rest.   May use Afrin nasal spray (or generic oxymetazoline) each morning for about 5 days and then discontinue.  Also recommend using saline nasal spray several times daily and saline nasal irrigation (AYR is a common brand).  Use Flonase nasal spray each morning after using Afrin nasal spray and saline nasal irrigation. Try warm salt water gargles for sore throat.  Stop all antihistamines for now, and other non-prescription cough/cold preparations. May take Ibuprofen 200mg, 4 tabs every 8 hours with food for body aches, headache, etc. May take Delsym Cough Suppressant at bedtime for nighttime cough.   If symptoms become significantly worse during the night or over the weekend, proceed to the local emergency room.   If COVID-19 test is positive, isolate yourself until the below conditions are met: 1)  At least 7 days since symptoms onset. AND 2)  > 72 hours after symptom resolution (absence of fever without the use of fever-reducing medicine, and improvement in respiratory symptoms.        ED Prescriptions    None        Beese, Stephen A, MD 05/03/19 1957  

## 2019-05-05 LAB — NOVEL CORONAVIRUS, NAA: SARS-CoV-2, NAA: NOT DETECTED

## 2019-11-30 ENCOUNTER — Emergency Department
Admission: EM | Admit: 2019-11-30 | Discharge: 2019-11-30 | Disposition: A | Discharge status: Home / Self Care | Payer: BLUE CROSS/BLUE SHIELD | Source: Home / Self Care

## 2019-11-30 ENCOUNTER — Emergency Department: Payer: BC Managed Care – PPO

## 2019-11-30 ENCOUNTER — Other Ambulatory Visit: Payer: Self-pay

## 2019-11-30 DIAGNOSIS — N451 Epididymitis: Secondary | ICD-10-CM

## 2019-11-30 DIAGNOSIS — N433 Hydrocele, unspecified: Secondary | ICD-10-CM

## 2019-11-30 DIAGNOSIS — N5082 Scrotal pain: Secondary | ICD-10-CM

## 2019-11-30 DIAGNOSIS — N503 Cyst of epididymis: Secondary | ICD-10-CM

## 2019-11-30 MED ORDER — SULFAMETHOXAZOLE-TRIMETHOPRIM 800-160 MG PO TABS: 1.0000 | ORAL_TABLET | Freq: Two times a day (BID) | ORAL | 0 refills | Status: AC

## 2019-11-30 MED ORDER — DOXYCYCLINE HYCLATE 100 MG PO CAPS: 100.0000 mg | ORAL_CAPSULE | Freq: Two times a day (BID) | ORAL | 0 refills | Status: DC

## 2019-11-30 NOTE — ED Triage Notes (Signed)
Pt c/o testicle swelling. Says hurts when lifting things. Also says its hot to touch. Also feels like there's a "knot" behind it. Ibuprofen prn.

## 2019-11-30 NOTE — ED Provider Notes (Signed)
[facility]    CSN: [id number] Arrival date & time: 11/30/19  1532      History   Chief Complaint Chief Complaint  Patient presents with  . Groin Swelling    HPI Isaac Little is a 54 y.o. male.   HPI  Isaac Little is a 54 y.o. male presenting to UC with c/o several weeks of scrotal discomfort, worse on Left side. He has felt a "knot" behind his Left testicle at times. He has taken ibuprofen with mild relief. He has noticed the area feeling "hot" to the touch. Denies concern for STIs. Denies fever, chills, n/v/d. No abdominal pain or back pain. No penile discharge or dysuria.  No hx of epididymitis in the past.    History reviewed. No pertinent past medical history.  There are no problems to display for this patient.   History reviewed. No pertinent surgical history.     Home Medications    Prior to Admission medications   Medication Sig Start Date End Date Taking? Authorizing Provider  magic mouthwash SOLN Take 5-10 mLs by mouth 3 (three) times daily as needed for mouth pain. 11/08/18   Noe Gens, PA-C  omeprazole (PRILOSEC) 20 MG capsule Take 1 capsule (20 mg total) by mouth daily. 11/08/18   Noe Gens, PA-C  sulfamethoxazole-trimethoprim (BACTRIM DS) 800-160 MG tablet Take 1 tablet by mouth 2 (two) times daily for 10 days. 11/30/19 12/10/19  Noe Gens, PA-C    Family History Family History  Problem Relation Age of Onset  . Heart disease Father     Social History Social History   Tobacco Use  . Smoking status: Never Smoker  . Smokeless tobacco: Never Used  Vaping Use  . Vaping Use: Never used  Substance Use Topics  . Alcohol use: No  . Drug use: Never     Allergies   Patient has no known allergies.   Review of Systems Review of Systems  Constitutional: Negative for chills and fever.  Gastrointestinal: Negative for abdominal pain, nausea and vomiting.  Genitourinary: Positive for scrotal swelling and testicular  pain. Negative for decreased urine volume, discharge, dysuria, frequency, hematuria and urgency.     Physical Exam Triage Vital Signs ED Triage Vitals [11/30/19 1542]  Enc Vitals Group     BP 126/77     Pulse Rate 78     Resp 18     Temp 98.1 F (36.7 C)     Temp Source Oral     SpO2 98 %     Weight      Height      Head Circumference      Peak Flow      Pain Score 2     Pain Loc      Pain Edu?      Excl. in [place]?    No data found.  Updated Vital Signs BP 126/77 (BP Location: Left Arm)   Pulse 78   Temp 98.1 F (36.7 C) (Oral)   Resp 18   SpO2 98%   Visual Acuity Right Eye Distance:   Left Eye Distance:   Bilateral Distance:    Right Eye Near:   Left Eye Near:    Bilateral Near:     Physical Exam Vitals and nursing note reviewed. Exam conducted with a chaperone present.  Constitutional:      Appearance: Normal appearance. He is well-developed.  HENT:     Head: Normocephalic and atraumatic.  Cardiovascular:  Rate and Rhythm: Normal rate.  Pulmonary:     Effort: Pulmonary effort is normal.  Abdominal:     Palpations: Abdomen is soft.     Tenderness: There is no abdominal tenderness.  Genitourinary:    Penis: Normal. No discharge.      Testes:        Right: Mass, tenderness or swelling not present.        Left: Mass ( tenderness over epididymus ) and tenderness present. Swelling not present.  Musculoskeletal:        General: Normal range of motion.     Cervical back: Normal range of motion.  Skin:    General: Skin is warm and dry.  Neurological:     Mental Status: He is alert and oriented to person, place, and time.  Psychiatric:        Behavior: Behavior normal.      UC Treatments / Results  Labs (all labs ordered are listed, but only abnormal results are displayed) Labs Reviewed - No data to display  EKG   Radiology US SCROTUM DOPPLER  Result Date: 11/30/2019 CLINICAL DATA:  Scrotal pain EXAM: SCROTAL ULTRASOUND DOPPLER ULTRASOUND  OF THE TESTICLES TECHNIQUE: Complete ultrasound examination of the testicles, epididymis, and other scrotal structures was performed. Color and spectral Doppler ultrasound were also utilized to evaluate blood flow to the testicles. COMPARISON:  None. FINDINGS: Right testicle Measurements: 4 x 2.3 x 2.7 cm. No mass or microlithiasis visualized. Left testicle Measurements: 3.8 x 2.4 x 2.5 cm. No mass or microlithiasis visualized. Right epididymis: Appears slightly enlarged and heterogeneous. Dominant cyst measuring up to 1.4 cm. Left epididymis:  Cyst measuring up to 6 mm. Hydrocele:  Small left greater than right hydroceles. Varicocele:  None visualized. Pulsed Doppler interrogation of both testes demonstrates normal low resistance arterial and venous waveforms bilaterally. IMPRESSION: 1. Negative for torsion or testicular mass lesion 2. Slight asymmetric enlargement of right epididymis with heterogeneous echotexture, question epididymitis. 3. Bilateral right greater than left epididymal cysts 4. Small bilateral hydrocele Electronically Signed   By: Jasmine Pang M.D.   On: 11/30/2019 17:01    Procedures Procedures (including critical care time)  Medications Ordered in UC Medications - No data to display  Initial Impression / Assessment and Plan / UC Course  I have reviewed the triage vital signs and the nursing notes.  Pertinent labs & imaging results that were available during my care of the patient were reviewed by me and considered in my medical decision making (see chart for details).     Discussed imaging with pt Will tx with bactrim Encouraged f/u with PCP and urology AVS provided  Final Clinical Impressions(s) / UC Diagnoses   Final diagnoses:  Scrotal pain  Right epididymitis  Epididymal cyst  Hydrocele, bilateral     Discharge Instructions      Please take antibiotics as prescribed and be sure to complete entire course even if you start to feel better to ensure infection  does not come back.  Call to schedule a follow up appointment with your primary care provider and a urologist if not improving by next week.     ED Prescriptions    Medication Sig Dispense Auth. Provider   doxycycline (VIBRAMYCIN) 100 MG capsule  (Status: Discontinued) Take 1 capsule (100 mg total) by mouth 2 (two) times daily for 10 days. 20 capsule Waylan Rocher O, PA-C   sulfamethoxazole-trimethoprim (BACTRIM DS) 800-160 MG tablet Take 1 tablet by mouth 2 (two) times daily  for 10 days. 20 tablet Lurene Shadow, [place]     PDMP not reviewed this encounter.   Lurene Shadow, [place] 11/30/19 1735

## 2019-11-30 NOTE — Discharge Instructions (Signed)
  Please take antibiotics as prescribed and be sure to complete entire course even if you start to feel better to ensure infection does not come back.  Call to schedule a follow up appointment with your primary care provider and a urologist if not improving by next week.

## 2020-06-26 ENCOUNTER — Other Ambulatory Visit: Payer: Self-pay

## 2020-06-26 ENCOUNTER — Emergency Department
Admission: EM | Admit: 2020-06-26 | Discharge: 2020-06-26 | Disposition: A | Discharge status: Home / Self Care | Payer: BC Managed Care – PPO | Source: Home / Self Care

## 2020-06-26 DIAGNOSIS — G5602 Carpal tunnel syndrome, left upper limb: Secondary | ICD-10-CM | POA: Diagnosis not present

## 2020-06-26 MED ORDER — PREDNISONE 20 MG PO TABS: ORAL_TABLET | ORAL | 0 refills | Status: DC

## 2020-06-26 NOTE — ED Triage Notes (Signed)
Pt c/o LT wrist pain x 2 mos. No known injury. Says pain radiates to thumb. Wearing a brace on and off since Christmas. Motrin prn. No previous injuries or surgeries to wrist.

## 2020-06-26 NOTE — Discharge Instructions (Addendum)
Start Prednisone 20 mg,  in mornings with breakfast as follows:  Take 3 pills for 3 days, Take 2 pills for 3 days, and Take 1 pill for 3 days.  Complete all medication.  Do not take ibuprofen/motrin while taking prednisone. For breakthrough pain, take tylenol 650 mg every 4-6 hours as needed. Continue bracing wrist while taking prednisone. If symptoms worsen or do not improve, follow-up Dr. Jordan Likes.

## 2020-06-26 NOTE — ED Provider Notes (Addendum)
[facility]    CSN: [id number] Arrival date & time: 06/26/20  1548      History   Chief Complaint Chief Complaint  Patient presents with  . Wrist Pain    LT    HPI Isaac Little is a 55 y.o. male.   HPI  Patient presents today for evaluation of two months of left wrist pain. Patient reports that he has been wearing wrist splints at bedtime to try and alleviate pain with mild improvement.  He has taken a few intermittent doses of ibuprofen but none consistently and reports medication did not help.  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  He endorses the pattern of pain starts at the medial lateral region of the wrist and extends into his left thumb.  He denies any paresthesias involving other portions of the hand or fingers.  Denies any known history of carpal tunnel.   History reviewed. No pertinent past medical history.  There are no problems to display for this patient.   History reviewed. No pertinent surgical history.     Home Medications    Prior to Admission medications   Medication Sig Start Date End Date Taking? Authorizing Provider  magic mouthwash SOLN Take 5-10 mLs by mouth 3 (three) times daily as needed for mouth pain. 11/08/18   Lurene Shadow, PA-C  omeprazole (PRILOSEC) 20 MG capsule Take 1 capsule (20 mg total) by mouth daily. 11/08/18   Lurene Shadow, PA-C    Family History Family History  Problem Relation Age of Onset  . Heart disease Father     Social History Social History   Tobacco Use  . Smoking status: Never Smoker  . Smokeless tobacco: Never Used  Vaping Use  . Vaping Use: Never used  Substance Use Topics  . Alcohol use: No  . Drug use: Never     Allergies   Patient has no known allergies.   Review of Systems Review of Systems  Pertinent negatives listed in HPI  Physical Exam Triage Vital Signs ED Triage Vitals [06/26/20 1747]  Enc  Vitals Group     BP 137/80     Pulse Rate 81     Resp 18     Temp 98.7 F (37.1 C)     Temp Source Oral     SpO2 100 %     Weight      Height      Head Circumference      Peak Flow      Pain Score 4     Pain Loc      Pain Edu?      Excl. in [place]?    No data found.  Updated Vital Signs BP 137/80 (BP Location: Right Arm)   Pulse 81   Temp 98.7 F (37.1 C) (Oral)   Resp 18   SpO2 100%   Visual Acuity Right Eye Distance:   Left Eye Distance:   Bilateral Distance:    Right Eye Near:   Left Eye Near:    Bilateral Near:     Physical Exam Constitutional:      Appearance: He is obese.  Cardiovascular:     Rate and Rhythm: Normal rate and regular rhythm.     Pulses:          Radial pulses are 2+ on the right side and 2+ on the left side.  Pulmonary:  Effort: Pulmonary effort is normal.     Breath sounds: Normal breath sounds.  Musculoskeletal:     Left wrist: Tenderness present. No snuff box tenderness. Normal range of motion.     Comments: Medial lateral wrist tenderness extending PIP joint of left thumb  Skin:    Capillary Refill: Capillary refill takes less than 2 seconds.  Neurological:     General: No focal deficit present.     Mental Status: He is alert and oriented to person, place, and time.     Sensory: Sensation is intact.     Motor: Motor function is intact.     Coordination: Coordination is intact.     Gait: Gait is intact.  Psychiatric:        Mood and Affect: Mood normal.        Behavior: Behavior normal.        Thought Content: Thought content normal.        Judgment: Judgment normal.     UC Treatments / Results  Labs (all labs ordered are listed, but only abnormal results are displayed) Labs Reviewed - No data to display  EKG   Radiology No results found.  Procedures Procedures (including critical care time)  Medications Ordered in UC Medications - No data to display  Initial Impression / Assessment and Plan / UC Course  I  have reviewed the triage vital signs and the nursing notes.  Pertinent labs & imaging results that were available during my care of the patient were reviewed by me and considered in my medical decision making (see chart for details).     Given distribution of pain and persistency x 2 months. Suspect carpal tunnel syndrome. Encouraged to continue nighttime wrist splinting . D/C ibuprofen. Start prednisone taper. If no improvement recommend follow-up with Dr. Clare Gandy .  Final Clinical Impressions(s) / UC Diagnoses   Final diagnoses:  Carpal tunnel syndrome of left wrist     Discharge Instructions     Start Prednisone 20 mg,  in mornings with breakfast as follows:  Take 3 pills for 3 days, Take 2 pills for 3 days, and Take 1 pill for 3 days.  Complete all medication.  Do not take ibuprofen/motrin while taking prednisone. For breakthrough pain, take tylenol 650 mg every 4-6 hours as needed. Continue bracing wrist while taking prednisone. If symptoms worsen or do not improve, follow-up Dr. Jordan Likes.      ED Prescriptions    Medication Sig Dispense Auth. Provider   predniSONE (DELTASONE) 20 MG tablet Take 3 PO QAM x3days, 2 PO QAM x3days, 1 PO QAM x3days 18 tablet Bing Neighbors, FNP     PDMP not reviewed this encounter.   Bing Neighbors, FNP 07/01/20 0026    Bing Neighbors, FNP 07/01/20 Jacinta Shoe

## 2020-08-27 ENCOUNTER — Other Ambulatory Visit: Payer: Self-pay

## 2020-08-27 ENCOUNTER — Emergency Department
Admission: EM | Admit: 2020-08-27 | Discharge: 2020-08-27 | Disposition: A | Discharge status: Home / Self Care | Payer: BC Managed Care – PPO | Source: Home / Self Care

## 2020-08-27 DIAGNOSIS — R0781 Pleurodynia: Secondary | ICD-10-CM

## 2020-08-27 DIAGNOSIS — Z23 Encounter for immunization: Secondary | ICD-10-CM | POA: Diagnosis not present

## 2020-08-27 DIAGNOSIS — T148XXA Other injury of unspecified body region, initial encounter: Secondary | ICD-10-CM

## 2020-08-27 MED ORDER — IBUPROFEN 800 MG PO TABS: 800.0000 mg | ORAL_TABLET | Freq: Three times a day (TID) | ORAL | 0 refills | Status: DC | PRN

## 2020-08-27 MED ORDER — CYCLOBENZAPRINE HCL 10 MG PO TABS: 10.0000 mg | ORAL_TABLET | Freq: Two times a day (BID) | ORAL | 0 refills | Status: DC | PRN

## 2020-08-27 MED ORDER — TETANUS-DIPHTH-ACELL PERTUSSIS 5-2.5-18.5 LF-MCG/0.5 IM SUSY
0.5000 mL | PREFILLED_SYRINGE | Freq: Once | INTRAMUSCULAR | Status: AC
  Administered 2020-08-27: 0.5 mL via INTRAMUSCULAR

## 2020-08-27 NOTE — Discharge Instructions (Signed)
I have sent in ibuprofen for you to take one tablet every 8 hours as needed for pain and inflammation.  I have sent in flexeril for you to take twice a day as needed for muscle spasms. This medication can make you sleepy. Do not drive or operate heavy machinery with this medication.  We have updated your Tdap vaccine in the office today  Follow up with this office or with primary care if symptoms are persisting.  Follow up in the ER for high fever, trouble swallowing, trouble breathing, other concerning symptoms.

## 2020-08-27 NOTE — ED Triage Notes (Addendum)
Patient presents to Urgent Care with complaints of RUQ abdominal pain since a week ago. Patient reports sometimes laying on his left side helps the pain, has been having regular BMs.  Pt would also like a tetanus shot, stepped on a nail barefoot last night and does not think he has had a tetanus shot in 20+ years.

## 2020-08-28 NOTE — ED Provider Notes (Signed)
[facility]    CSN: [id number] Arrival date & time: 08/27/20  1558      History   Chief Complaint Chief Complaint  Patient presents with  . Abdominal Pain    RUQ    HPI Isaac Little is a 55 y.o. male.   Reports that he stepped on an old nail yesterday. Denies pain, drainage from the area. Is requesting update on Tdap vaccine today.  Also c/o right rib pain for the last 2 weeks that is worse with certain twisting motions and activity. Denies known injury. Denies recent illness. Has taken 200mg  ibuprofen last night with little relief. Denies previous symptoms. Denies headache, cough, sore throat, nausea, vomiting, diarrhea, rash, fever, other symptoms.  ROS per HPI  The history is provided by the patient.  Abdominal Pain   History reviewed. No pertinent past medical history.  There are no problems to display for this patient.   History reviewed. No pertinent surgical history.     Home Medications    Prior to Admission medications   Medication Sig Start Date End Date Taking? Authorizing Provider  cyclobenzaprine (FLEXERIL) 10 MG tablet Take 1 tablet (10 mg total) by mouth 2 (two) times daily as needed for muscle spasms. 08/27/20  Yes 08/29/20, NP  ibuprofen (ADVIL) 800 MG tablet Take 1 tablet (800 mg total) by mouth every 8 (eight) hours as needed for moderate pain. 08/27/20  Yes 08/29/20, NP  magic mouthwash SOLN Take 5-10 mLs by mouth 3 (three) times daily as needed for mouth pain. 11/08/18   11/10/18, PA-C  omeprazole (PRILOSEC) 20 MG capsule Take 1 capsule (20 mg total) by mouth daily. 11/08/18   11/10/18, PA-C  predniSONE (DELTASONE) 20 MG tablet Take 3 PO QAM x3days, 2 PO QAM x3days, 1 PO QAM x3days 06/26/20   08/24/20, FNP    Family History Family History  Problem Relation Age of Onset  . Heart disease Father   . Diabetes Father   . Healthy Mother     Social History Social History   Tobacco Use   . Smoking status: Never Smoker  . Smokeless tobacco: Never Used  Vaping Use  . Vaping Use: Never used  Substance Use Topics  . Alcohol use: No  . Drug use: Never     Allergies   Patient has no known allergies.   Review of Systems Review of Systems  Gastrointestinal: Positive for abdominal pain.     Physical Exam Triage Vital Signs ED Triage Vitals  Enc Vitals Group     BP 08/27/20 1617 132/83     Pulse Rate 08/27/20 1617 72     Resp 08/27/20 1617 16     Temp 08/27/20 1617 98.6 F (37 C)     Temp Source 08/27/20 1617 Oral     SpO2 08/27/20 1617 99 %     Weight --      Height --      Head Circumference --      Peak Flow --      Pain Score 08/27/20 1615 3     Pain Loc --      Pain Edu? --      Excl. in [place]? --    No data found.  Updated Vital Signs BP 132/83 (BP Location: Left Arm)   Pulse 72   Temp 98.6 F (37 C) (Oral)   Resp 16   SpO2 99%   Visual Acuity Right Eye Distance:  Left Eye Distance:   Bilateral Distance:    Right Eye Near:   Left Eye Near:    Bilateral Near:     Physical Exam Vitals and nursing note reviewed.  Constitutional:      General: He is not in acute distress.    Appearance: He is well-developed. He is not ill-appearing.  HENT:     Head: Normocephalic and atraumatic.  Eyes:     Conjunctiva/sclera: Conjunctivae normal.  Cardiovascular:     Rate and Rhythm: Normal rate and regular rhythm.     Heart sounds: Normal heart sounds. No murmur heard.   Pulmonary:     Effort: Pulmonary effort is normal. No respiratory distress.     Breath sounds: Normal breath sounds. No stridor. No wheezing, rhonchi or rales.  Chest:     Chest wall: No tenderness.  Abdominal:     General: Bowel sounds are normal.     Palpations: Abdomen is soft.     Tenderness: There is no abdominal tenderness. There is no right CVA tenderness, left CVA tenderness, guarding or rebound. Negative signs include Murphy's sign, Rovsing's sign, McBurney's sign,  psoas sign and obturator sign.     Hernia: No hernia is present.  Musculoskeletal:     Cervical back: Neck supple.  Skin:    General: Skin is warm and dry.     Capillary Refill: Capillary refill takes less than 2 seconds.  Neurological:     General: No focal deficit present.     Mental Status: He is alert and oriented to person, place, and time.  Psychiatric:        Mood and Affect: Mood normal.        Behavior: Behavior normal.      UC Treatments / Results  Labs (all labs ordered are listed, but only abnormal results are displayed) Labs Reviewed - No data to display  EKG   Radiology No results found.  Procedures Procedures (including critical care time)  Medications Ordered in UC Medications  Tdap (BOOSTRIX) injection 0.5 mL (0.5 mLs Intramuscular Given 08/27/20 1644)    Initial Impression / Assessment and Plan / UC Course  I have reviewed the triage vital signs and the nursing notes.  Pertinent labs & imaging results that were available during my care of the patient were reviewed by me and considered in my medical decision making (see chart for details).    Right rib pain Muscle Strain Need for tetanus vaccine  Tdap updated in office today Will treat rib pain with ibuprofen and cyclobenzaprine Do not take cyclobenzaprine and drive or operate heavy machinery while taking this medication Follow up with sports medicine if symptoms are persisting Follow up in the ER for shortness of breath, high fever, trouble swallowing or other concerning symtpoms  Final Clinical Impressions(s) / UC Diagnoses   Final diagnoses:  Rib pain on right side  Need for tetanus, diphtheria, and acellular pertussis (Tdap) vaccine  Muscle strain     Discharge Instructions     I have sent in ibuprofen for you to take one tablet every 8 hours as needed for pain and inflammation.  I have sent in flexeril for you to take twice a day as needed for muscle spasms. This medication can  make you sleepy. Do not drive or operate heavy machinery with this medication.  We have updated your Tdap vaccine in the office today  Follow up with this office or with primary care if symptoms are persisting.  Follow up  in the ER for high fever, trouble swallowing, trouble breathing, other concerning symptoms.     ED Prescriptions    Medication Sig Dispense Auth. Provider   ibuprofen (ADVIL) 800 MG tablet Take 1 tablet (800 mg total) by mouth every 8 (eight) hours as needed for moderate pain. 21 tablet Moshe Cipro, NP   cyclobenzaprine (FLEXERIL) 10 MG tablet Take 1 tablet (10 mg total) by mouth 2 (two) times daily as needed for muscle spasms. 20 tablet Moshe Cipro, NP     PDMP not reviewed this encounter.   Moshe Cipro, NP 08/28/20 [phone]

## 2020-08-30 ENCOUNTER — Other Ambulatory Visit: Payer: Self-pay

## 2020-08-30 ENCOUNTER — Ambulatory Visit: Payer: Self-pay

## 2020-08-30 ENCOUNTER — Encounter: Payer: Self-pay | Admitting: Family Medicine

## 2020-08-30 ENCOUNTER — Ambulatory Visit: Payer: BC Managed Care – PPO | Admitting: Family Medicine

## 2020-08-30 VITALS — BP 122/80 | HR 79 | Ht 71.0 in | Wt 225.0 lb

## 2020-08-30 DIAGNOSIS — M25532 Pain in left wrist: Secondary | ICD-10-CM

## 2020-08-30 DIAGNOSIS — M654 Radial styloid tenosynovitis [de Quervain]: Secondary | ICD-10-CM | POA: Diagnosis not present

## 2020-08-30 MED ORDER — METHYLPREDNISOLONE ACETATE 40 MG/ML IJ SUSP
40.0000 mg | Freq: Once | INTRAMUSCULAR | Status: AC
  Administered 2020-08-30: 40 mg via INTRA_ARTICULAR

## 2020-08-30 NOTE — Patient Instructions (Signed)
Nice to meet you Please try ice as needed  Please try the brace at night or doing any repetitive movements.  Please try the exercises   Please send me a message in MyChart with any questions or updates.  Please see me back in 4 weeks.   --Dr. Jordan Likes

## 2020-08-30 NOTE — Progress Notes (Unsigned)
  Isaac Little - 55 y.o. male MRN [id number]  Date of birth: 05-28-1966  SUBJECTIVE:  Including CC & ROS.  No chief complaint on file.   Isaac Little is a 55 y.o. male that is presenting with left wrist pain.  The pain has been ongoing for a few weeks.  He does repetitive activities with work with picking up boxes.  Review of Systems See HPI   HISTORY: Past Medical, Surgical, Social, and Family History Reviewed & Updated per EMR.   Pertinent Historical Findings include:  No past medical history on file.  No past surgical history on file.  Family History  Problem Relation Age of Onset  . Heart disease Father   . Diabetes Father   . Healthy Mother     Social History   Socioeconomic History  . Marital status: Single    Spouse name: Not on file  . Number of children: Not on file  . Years of education: Not on file  . Highest education level: Not on file  Occupational History  . Not on file  Tobacco Use  . Smoking status: Never Smoker  . Smokeless tobacco: Never Used  Vaping Use  . Vaping Use: Never used  Substance and Sexual Activity  . Alcohol use: No  . Drug use: Never  . Sexual activity: Not on file  Other Topics Concern  . Not on file  Social History Narrative  . Not on file   Social Determinants of Health   Financial Resource Strain: Not on file  Food Insecurity: Not on file  Transportation Needs: Not on file  Physical Activity: Not on file  Stress: Not on file  Social Connections: Not on file  Intimate Partner Violence: Not on file     PHYSICAL EXAM:  VS: BP 122/80   Pulse 79   Ht 5\' 11"  (1.803 m)   Wt 225 lb (102.1 kg)   SpO2 99%   BMI 31.38 kg/m  Physical Exam Gen: NAD, alert, cooperative with exam, well-appearing MSK:  Left wrist: Normal range of motion. Normal strength resistance. Tenderness over the first dorsal compartment. Positive Finkelstein's test. Neurovascular intact  Limited ultrasound: Left wrist:  No effusion  within the carpal joints. Has a small effusion at the intersection. Effusion encircling the first dorsal compartment but no hyperemia.   Summary: Most consistent with de Quervain's tenosynovitis.  Ultrasound and interpretation by , MD   Aspiration/Injection Procedure Note Isaac Little 10-20-1965  Procedure: Injection Indications: Left wrist pain  Procedure Details Consent: Risks of procedure as well as the alternatives and risks of each were explained to the (patient/caregiver).  Consent for procedure obtained. Time Out: Verified patient identification, verified procedure, site/side was marked, verified correct patient position, special equipment/implants available, medications/allergies/relevent history reviewed, required imaging and test results available.  Performed.  The area was cleaned with iodine and alcohol swabs.    The left first dorsal compartment was injected using 1 cc's of 40 mg Depo-Medrol and 1 cc's of 0.25% bupivacaine with a 25 1 1/2" needle.  Ultrasound was used. Images were obtained in long views showing the injection.     A sterile dressing was applied.  Patient did tolerate procedure well.    ASSESSMENT & PLAN:   De Quervain's tenosynovitis, left Does repetitive maneuvers at work that likely exacerbated symptoms.  He does have changes at the intersection as well. -Counseled on home exercise therapy and supportive care. -Injection. -Thumb spica splint. -Could consider physical therapy

## 2020-08-31 NOTE — Assessment & Plan Note (Signed)
Does repetitive maneuvers at work that likely exacerbated symptoms.  He does have changes at the intersection as well. -Counseled on home exercise therapy and supportive care. -Injection. -Thumb spica splint. -Could consider physical therapy

## 2021-03-09 IMAGING — US US SCROTUM W/ DOPPLER COMPLETE
1 series · 14 of 25 positions shown · non-contrast
Comparison: None.

CLINICAL DATA: Scrotal pain

EXAM:
SCROTAL ULTRASOUND
DOPPLER ULTRASOUND OF THE TESTICLES
TECHNIQUE: Complete ultrasound examination of the testicles, epididymis, and
other scrotal structures was performed. Color and spectral Doppler
ultrasound were also utilized to evaluate blood flow to the
testicles.

[Series 1: us scrotum w/ doppler complete · 0.06mm/px · 14 of 55 slices shown]
[im 1/55]
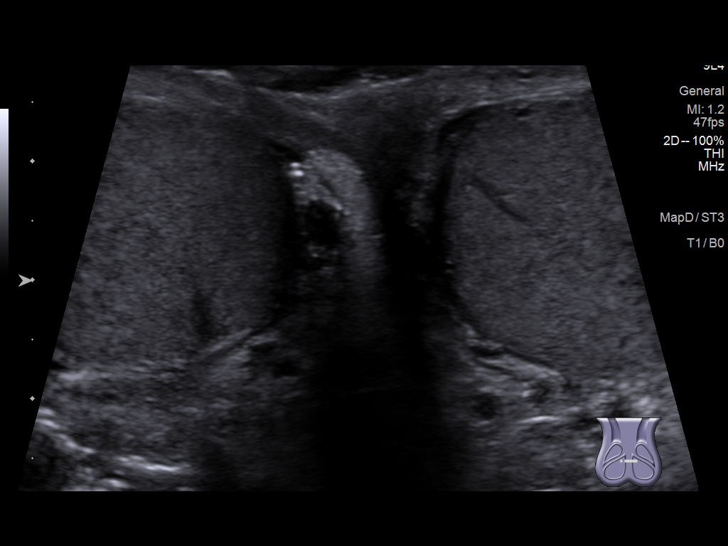
[im 5/55]
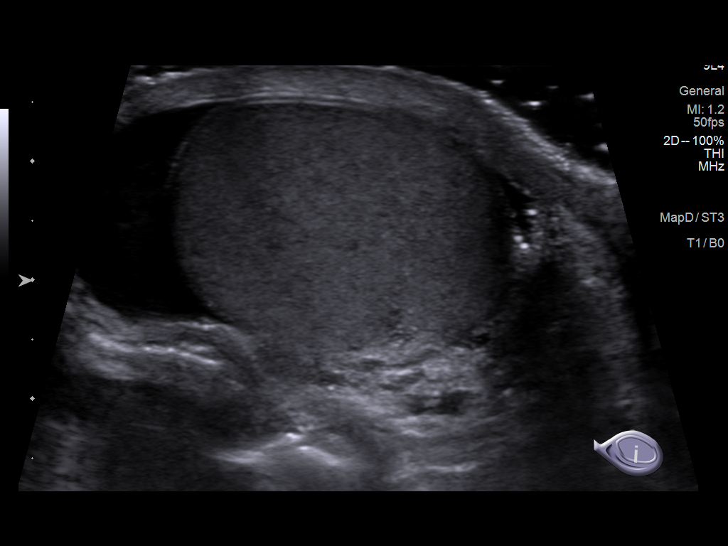
[im 10/55]
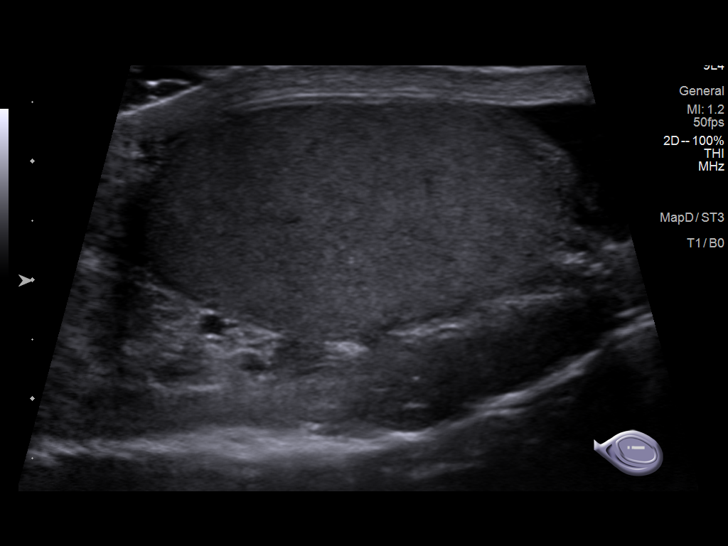
[im 14/55]
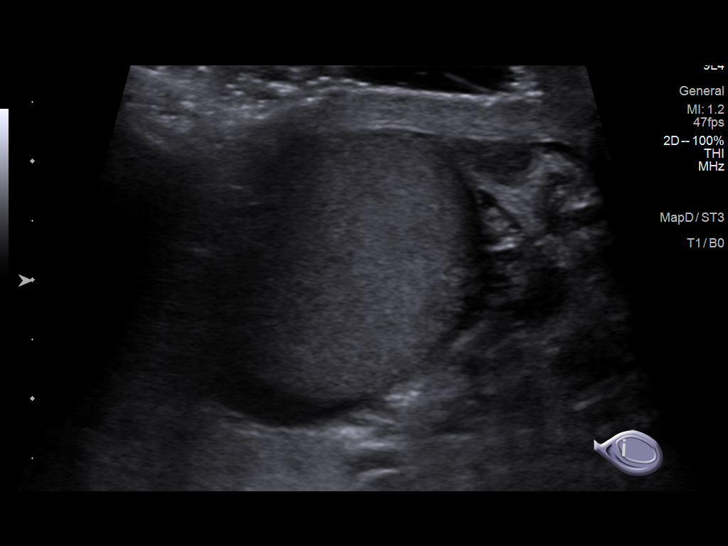
[im 19/55]
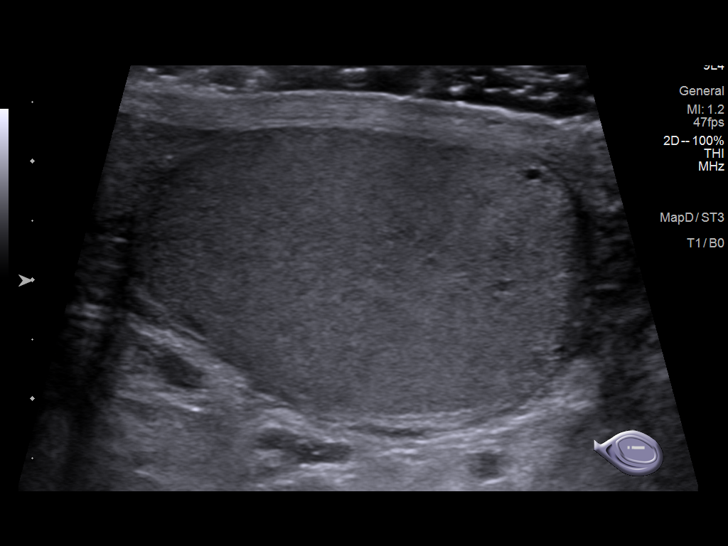
[im 21/55]
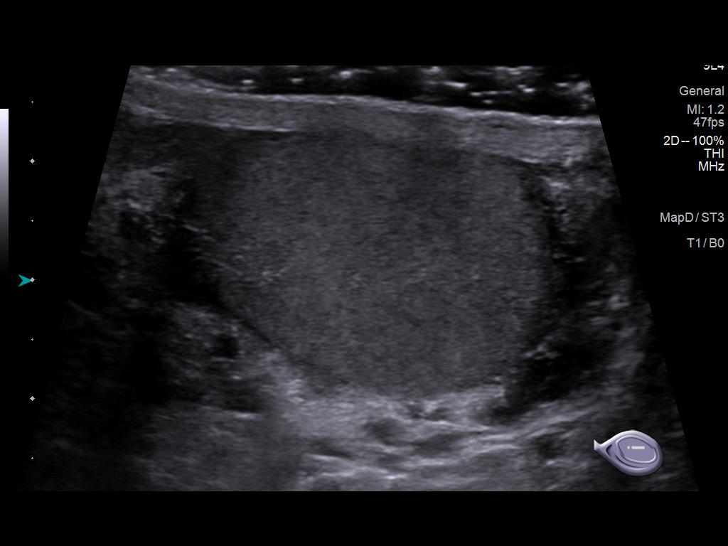
[im 25/55]
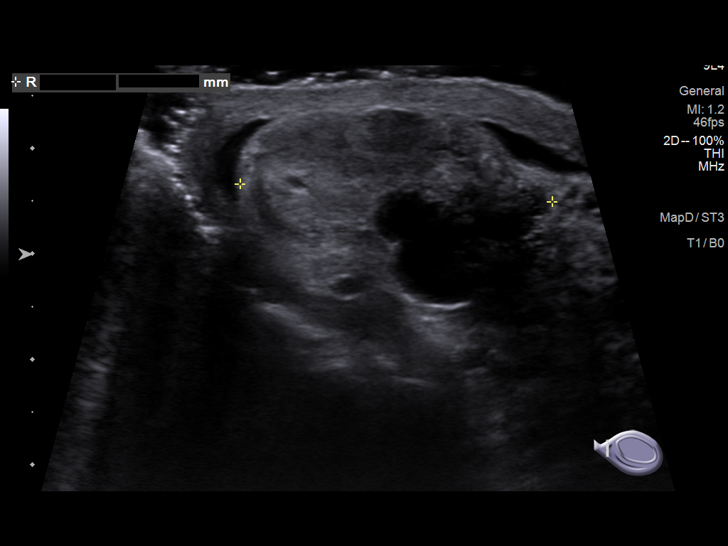
[im 30/55]
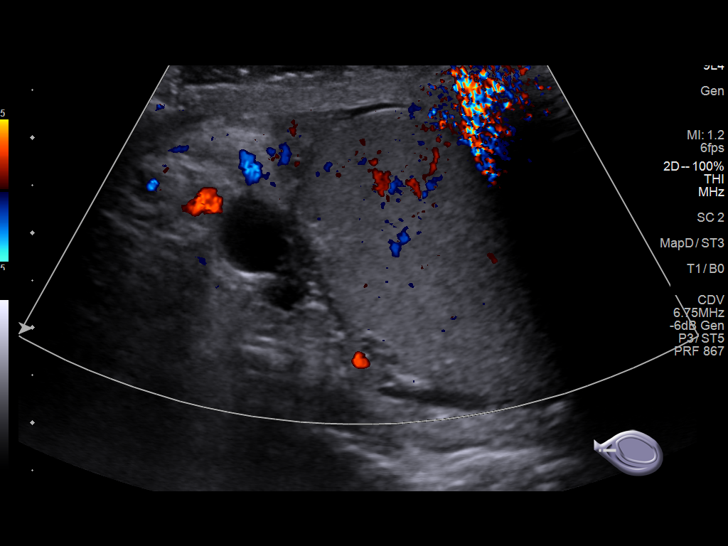
[im 34/55]
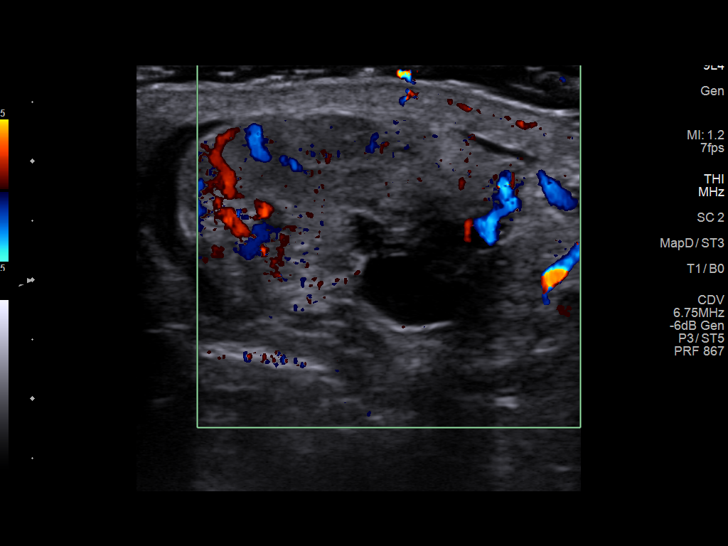
[im 37/55]
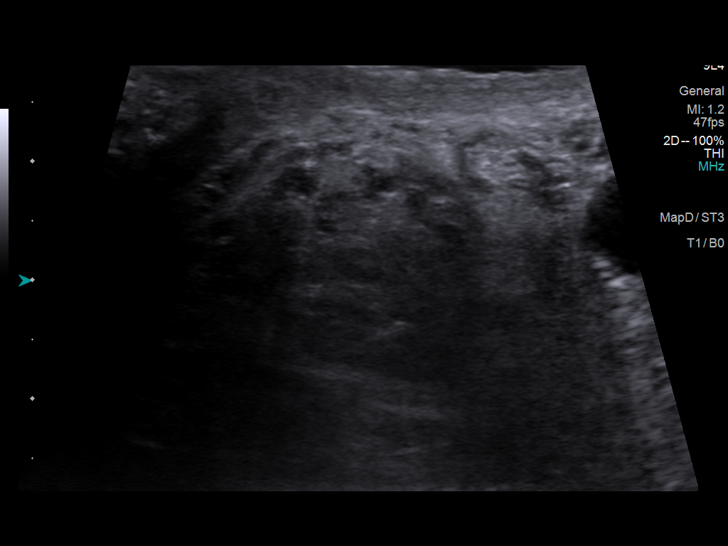
[im 41/55]
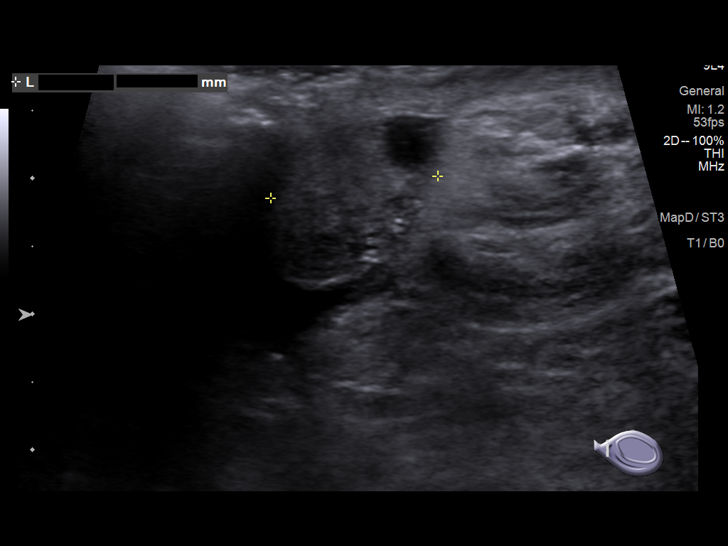
[im 46/55]
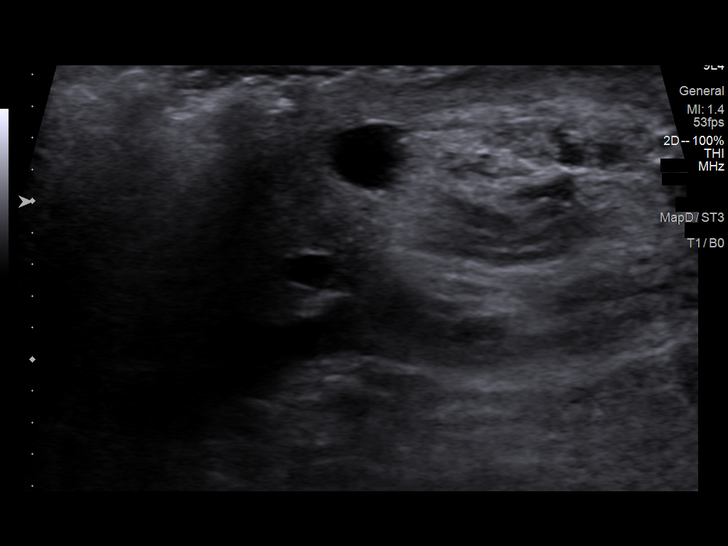
[im 50/55]
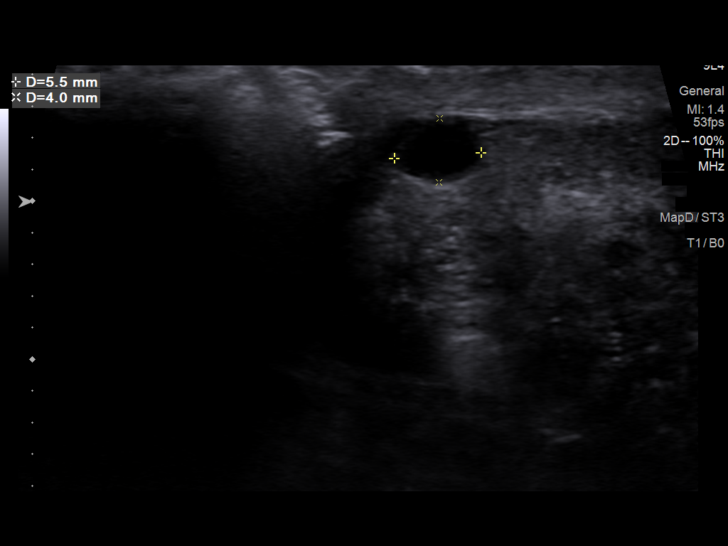
[im 55/55]
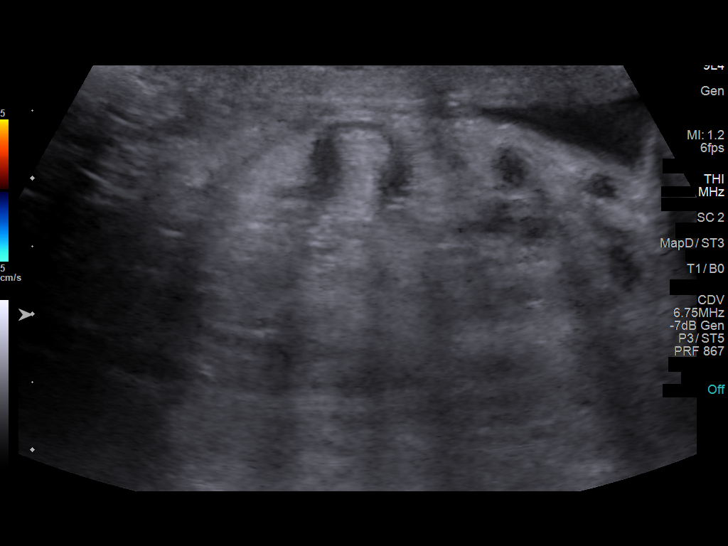

[14 of 25 positions shown; findings below may reference images not displayed]

FINDINGS: Right testicle

Measurements: 4 x 2.3 x 2.7 cm. No mass or microlithiasis
visualized.

Left testicle

Measurements: 3.8 x 2.4 x 2.5 cm. No mass or microlithiasis
visualized.

Right epididymis: Appears slightly enlarged and heterogeneous.
Dominant cyst measuring up to 1.4 cm.

Left epididymis:  Cyst measuring up to 6 mm.

Hydrocele:  Small left greater than right hydroceles.

Varicocele:  None visualized.

Pulsed Doppler interrogation of both testes demonstrates normal low
resistance arterial and venous waveforms bilaterally.
IMPRESSION: 1. Negative for torsion or testicular mass lesion
2. Slight asymmetric enlargement of right epididymis with
heterogeneous echotexture, question epididymitis.
3. Bilateral right greater than left epididymal cysts
4. Small bilateral hydrocele

## 2021-11-23 ENCOUNTER — Emergency Department (INDEPENDENT_AMBULATORY_CARE_PROVIDER_SITE_OTHER): Payer: BC Managed Care – PPO

## 2021-11-23 ENCOUNTER — Encounter: Payer: Self-pay | Admitting: Emergency Medicine

## 2021-11-23 ENCOUNTER — Emergency Department
Admission: EM | Admit: 2021-11-23 | Discharge: 2021-11-23 | Disposition: A | Discharge status: Home / Self Care | Payer: BC Managed Care – PPO | Source: Home / Self Care

## 2021-11-23 DIAGNOSIS — R1032 Left lower quadrant pain: Secondary | ICD-10-CM

## 2021-11-23 DIAGNOSIS — R3911 Hesitancy of micturition: Secondary | ICD-10-CM

## 2021-11-23 DIAGNOSIS — N401 Enlarged prostate with lower urinary tract symptoms: Secondary | ICD-10-CM | POA: Diagnosis not present

## 2021-11-23 DIAGNOSIS — K760 Fatty (change of) liver, not elsewhere classified: Secondary | ICD-10-CM | POA: Diagnosis not present

## 2021-11-23 DIAGNOSIS — K209 Esophagitis, unspecified without bleeding: Secondary | ICD-10-CM | POA: Diagnosis not present

## 2021-11-23 DIAGNOSIS — I700 Atherosclerosis of aorta: Secondary | ICD-10-CM

## 2021-11-23 HISTORY — DX: Gastro-esophageal reflux disease without esophagitis: K21.9

## 2021-11-23 LAB — POCT URINALYSIS DIP (MANUAL ENTRY)
Bilirubin, UA: NEGATIVE
Blood, UA: NEGATIVE
Glucose, UA: NEGATIVE mg/dL
Ketones, POC UA: NEGATIVE mg/dL
Leukocytes, UA: NEGATIVE
Nitrite, UA: NEGATIVE
Protein Ur, POC: NEGATIVE mg/dL
Spec Grav, UA: 1.02 (ref 1.010–1.025)
Urobilinogen, UA: 0.2 E.U./dL
pH, UA: 5.5 (ref 5.0–8.0)

## 2021-11-23 MED ORDER — TAMSULOSIN HCL 0.4 MG PO CAPS: 0.4000 mg | ORAL_CAPSULE | Freq: Every day | ORAL | 0 refills | Status: AC

## 2021-11-23 MED ORDER — PANTOPRAZOLE SODIUM 40 MG PO TBEC: 40.0000 mg | DELAYED_RELEASE_TABLET | Freq: Every day | ORAL | 0 refills | Status: AC

## 2021-11-23 NOTE — ED Triage Notes (Signed)
Lower abdominal pain x 3 days Denies problems with urination Describes it as pressure  Increased BM's ( 4 x day) Drinking cranberry juice  RLQ pain x 1 week LUQ lateral pain x 3 days  Hx of kidney stones  - no surgery  No OTC meds  Icy Hot & heating pad - mod relief

## 2021-11-23 NOTE — ED Provider Notes (Signed)
[facility]    CSN: [id number] Arrival date & time: 11/23/21  1005      History   Chief Complaint Chief Complaint  Patient presents with   Abdominal Pain    HPI Isaac Little is a 56 y.o. Little.   Isaac 56yo Little presents today with concerns of new onset abdominal and pelvic pain. He has a known hx of GERD, used to be on PPI, but states he has not taken it for "a while." Is taking OTC pepcid. Also has hx of esophageal stricture, has been stretched in the past. Pt states "feels like it might need to be stretched again here soon." Pt states about one week ago, started having a dull pain to his R hip region. Thought it was a pulled muscle. Did not take anything for it. Started having dull nagging L flank pain a day or two after that. Those pains are still present, but yesterday started having pressure in the suprapubic region. Denies dysuria, possible slight frequency, no hematuria. No hx of kidney stones. No aggravating or alleviating factors. No constipation. No rash, nausea or vomiting. Normal appetite. Did have increased number of Bms yesterday with looser consistency, but denies diarrhea today. No recent travel or camping. No food sources known. No fever. Pain 4/10.   Abdominal Pain   Past Medical History:  Diagnosis Date   GERD (gastroesophageal reflux disease)     Patient Active Problem List   Diagnosis Date Noted   De Quervain's tenosynovitis, left 08/30/2020    History reviewed. No pertinent surgical history.     Home Medications    Prior to Admission medications   Medication Sig Start Date End Date Taking? Authorizing Provider  pantoprazole (PROTONIX) 40 MG tablet Take 1 tablet (40 mg total) by mouth daily. 11/23/21  Yes Kumar Falwell L, PA  tamsulosin (FLOMAX) 0.4 MG CAPS capsule Take 1 capsule (0.4 mg total) by mouth daily. 11/23/21 12/23/21 Yes Eryn Krejci, Jodelle Gross, PA    Family History Family History  Problem Relation Age of Onset   Heart  disease Father    Diabetes Father    Healthy Mother     Social History Social History   Tobacco Use   Smoking status: Never   Smokeless tobacco: Never  Vaping Use   Vaping Use: Never used  Substance Use Topics   Alcohol use: No   Drug use: Never     Allergies   Patient has no known allergies.   Review of Systems Review of Systems  Gastrointestinal:  Positive for abdominal pain.  All other systems reviewed and are negative.    Physical Exam Triage Vital Signs ED Triage Vitals  Enc Vitals Group     BP 11/23/21 1020 138/90     Pulse Rate 11/23/21 1020 63     Resp 11/23/21 1020 17     Temp 11/23/21 1020 98.6 F (37 C)     Temp Source 11/23/21 1020 Oral     SpO2 11/23/21 1020 99 %     Weight 11/23/21 1021 220 lb (99.8 kg)     Height 11/23/21 1021 5\' 11"  (1.803 m)     Head Circumference --      Peak Flow --      Pain Score 11/23/21 1021 4     Pain Loc --      Pain Edu? --      Excl. in GC? --    No data found.  Updated Vital Signs BP 138/90 (BP Location:  Right Arm)   Pulse 63   Temp 98.6 F (37 C) (Oral)   Resp 17   Ht 5\' 11"  (1.803 m)   Wt 220 lb (99.8 kg)   SpO2 99%   BMI 30.68 kg/m   Visual Acuity Right Eye Distance:   Left Eye Distance:   Bilateral Distance:    Right Eye Near:   Left Eye Near:    Bilateral Near:     Physical Exam Vitals and nursing note reviewed.  Constitutional:      General: He is not in acute distress.    Appearance: He is well-developed. He is obese. He is not ill-appearing, toxic-appearing or diaphoretic.  HENT:     Head: Normocephalic and atraumatic.     Mouth/Throat:     Mouth: Mucous membranes are moist.     Pharynx: Oropharynx is clear. No pharyngeal swelling or oropharyngeal exudate.  Eyes:     General: No scleral icterus.    Extraocular Movements: Extraocular movements intact.     Pupils: Pupils are equal, round, and reactive to light.  Cardiovascular:     Rate and Rhythm: Normal rate and regular rhythm.      Heart sounds: Normal heart sounds. No murmur heard.    No friction rub.  Pulmonary:     Effort: Pulmonary effort is normal. No respiratory distress.     Breath sounds: Normal breath sounds. No stridor. No wheezing, rhonchi or rales.  Chest:     Chest wall: No tenderness.  Abdominal:     General: Abdomen is flat. Bowel sounds are normal. There is no distension. There are no signs of injury.     Palpations: Abdomen is soft. There is no shifting dullness, hepatomegaly, splenomegaly or mass.     Tenderness: There is abdominal tenderness in the left lower quadrant. There is guarding (however improved with knees bent. Pt smiling during exam). There is no right CVA tenderness, left CVA tenderness or rebound. Negative signs include Murphy's sign, McBurney's sign, psoas sign and obturator sign.     Hernia: No hernia is present.  Musculoskeletal:        General: No swelling, tenderness, deformity or signs of injury. Normal range of motion.     Comments: No reproducible tenderness to palpation of the R hip or L flank. NO rib pain Orthopedic exam unremarkable  Skin:    General: Skin is warm and dry.     Capillary Refill: Capillary refill takes less than 2 seconds.     Coloration: Skin is not cyanotic or pale.     Findings: No erythema or rash.  Neurological:     General: No focal deficit present.     Mental Status: He is alert and oriented to person, place, and time.     Cranial Nerves: No cranial nerve deficit.  Psychiatric:        Mood and Affect: Mood normal.        Behavior: Behavior normal.      UC Treatments / Results  Labs (all labs ordered are listed, but only abnormal results are displayed) Labs Reviewed  POCT URINALYSIS DIP (MANUAL ENTRY)    EKG   Radiology CT RENAL STONE STUDY  Result Date: 11/23/2021 CLINICAL DATA:  Left lower quadrant abdominal pain EXAM: CT ABDOMEN AND PELVIS WITHOUT CONTRAST TECHNIQUE: Multidetector CT imaging of the abdomen and pelvis was  performed following the standard protocol without IV contrast. RADIATION DOSE REDUCTION: This exam was performed according to the departmental dose-optimization program which includes  automated exposure control, adjustment of the mA and/or kV according to patient size and/or use of iterative reconstruction technique. COMPARISON:  10/20/2009 FINDINGS: Lower chest: No acute abnormality. Hepatobiliary: Mild hepatic steatosis. No focal liver abnormality. Gallbladder appears normal. No bile duct dilatation. Pancreas: Unremarkable. No pancreatic ductal dilatation or surrounding inflammatory changes. Spleen: Normal in size without focal abnormality. Adrenals/Urinary Tract: Normal adrenal glands. No kidney stone, mass, or hydronephrosis identified bilaterally. No hydroureter or ureteral lithiasis. Urinary bladder is unremarkable. Stomach/Bowel: There is circumferential wall thickening involving the distal esophagus, image 8/2. This is a nonspecific finding and may reflect underlying esophagitis. Stomach is nondistended. The appendix is visualized and appears normal. No signs small bowel wall thickening, inflammation or distension. Scattered colonic diverticula are noted without signs of acute diverticulitis. Vascular/Lymphatic: Aortic atherosclerosis. Focal area of soft tissue stranding within the jejunal mesentery is identified, image 34/2. No small bowel, retroperitoneal or pelvic adenopathy. Reproductive: Prostate gland enlargement. Other: No free fluid or fluid collections. Musculoskeletal: No acute or significant osseous findings. IMPRESSION: 1. No acute findings within the abdomen or pelvis. There are scattered colonic diverticula without signs of acute diverticulitis. 2. Soft tissue stranding identified within the jejunal mesentery. This is often referred to as misty mesentery. Although this may be associated with metastatic disease and lymphoproliferative disorder, in the absence of concomitant adenopathy this is  considered a benign nonspecific finding. 3. Mild hepatic steatosis. 4. Prostate gland enlargement. 5. Aortic Atherosclerosis (ICD10-I70.0). Electronically Signed   By: Signa Kell M.D.   On: 11/23/2021 11:26    Procedures Procedures (including critical care time)  Medications Ordered in UC Medications - No data to display  Initial Impression / Assessment and Plan / UC Course  I have reviewed the triage vital signs and the nursing notes.  Pertinent labs & imaging results that were available during my care of the patient were reviewed by me and considered in my medical decision making (see chart for details).     LLQ pain - CT scan abd/pelvis does not reveal diverticulitis. Pt afebrile, VSS. No signs of peritonitis on exam. No objective findings to suggest a more significant process. No stones noted to urinary system. Pt did recently had diarrhea, question if sx secondary to resolving VGE BPH - pt having mild suprapubic pressure. BPH noted on CT. Will start flomax for symptomatic support. Urine sample in office negative. Esophagitis - pt with known hx of this. Used to be on Rx PPI, has not been on one in a while. This is seen in the CT scan, recommended follow up with his GI specialist for possible repeat EGD. Control of esophagitis needed to prevent Barretts esophagus. Restart PPI today. Hepatic steatosis - incidental finding. Will need workup with PCP. Not the cause of current symptoms. Aortic atherosclerosis - needs full workup with PCP for lipid eval and possible statin.   Final Clinical Impressions(s) / UC Diagnoses   Final diagnoses:  Left lower quadrant abdominal pain  Benign prostatic hyperplasia with urinary hesitancy  Esophagitis  Hepatic steatosis  Aortic atherosclerosis [facility])     Discharge Instructions      Your CT does not show evidence of appendicitis or diverticulitis. There are also no visible kidney stones. Your urine shows no signs of infection. I suspect your  pelvic discomfort may be related to urinary hesitancy from BPH (enlarged prostate). I would like you to start flomax daily to see if this helps your symptoms. Your CT also shows inflammation of the distal esophagus consistent with esophagitis.  Please follow up with you GI specialist. This needs to be monitored. Please start the protonix called in today. Your PCP should be aware of the other CT findings (fatty liver, atherosclerosis) and follow up should be performed.     ED Prescriptions     Medication Sig Dispense Auth. Provider   pantoprazole (PROTONIX) 40 MG tablet Take 1 tablet (40 mg total) by mouth daily. 30 tablet Khristy Kalan L, PA   tamsulosin (FLOMAX) 0.4 MG CAPS capsule Take 1 capsule (0.4 mg total) by mouth daily. 30 capsule Addysin Porco L, [place]      PDMP not reviewed this encounter.   Maretta Bees, [place] 11/24/21 1122

## 2021-11-23 NOTE — Discharge Instructions (Addendum)
Your CT does not show evidence of appendicitis or diverticulitis. There are also no visible kidney stones. Your urine shows no signs of infection. I suspect your pelvic discomfort may be related to urinary hesitancy from BPH (enlarged prostate). I would like you to start flomax daily to see if this helps your symptoms. Your CT also shows inflammation of the distal esophagus consistent with esophagitis. Please follow up with you GI specialist. This needs to be monitored. Please start the protonix called in today. Your PCP should be aware of the other CT findings (fatty liver, atherosclerosis) and follow up should be performed.

## 2022-10-02 ENCOUNTER — Encounter: Payer: Self-pay | Admitting: *Deleted

## 2023-03-03 IMAGING — CT CT RENAL STONE PROTOCOL
2 of 4 series · 16 of 46 positions shown, 18 images · non-contrast
Comparison: 10/20/2009

CLINICAL DATA: Left lower quadrant abdominal pain



[Series 2: axial st · axial · 0.77mm/px · z∈[+656,+1126]mm · 13 of 104 slices shown, 15 images]
[im 5/104  soft-tissue]
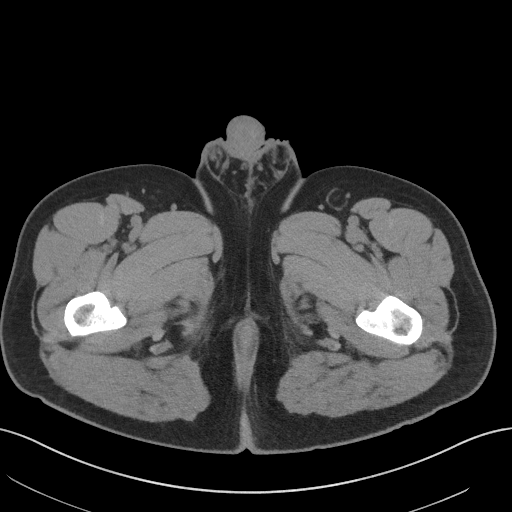
[im 5/104  bone]
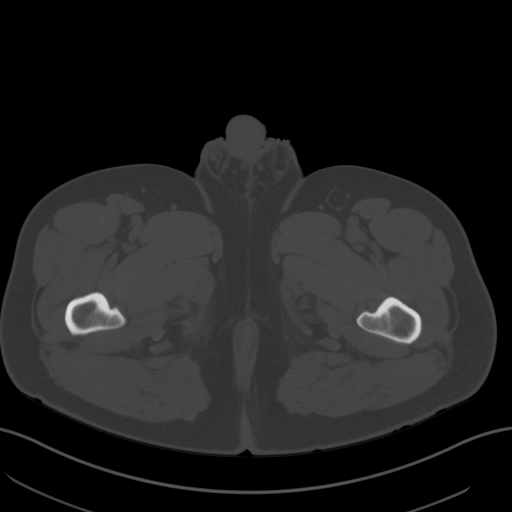
[im 13/104  soft-tissue]
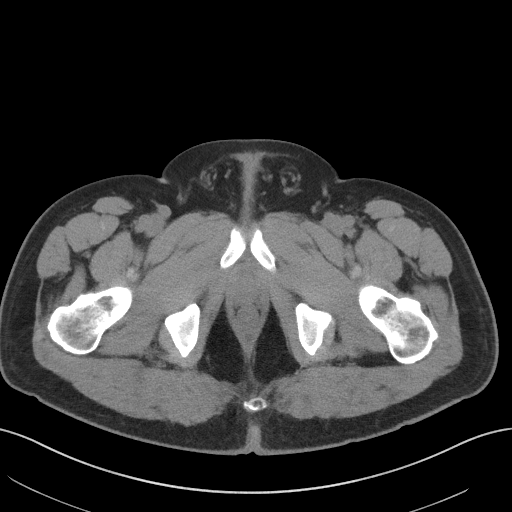
[im 21/104  soft-tissue]
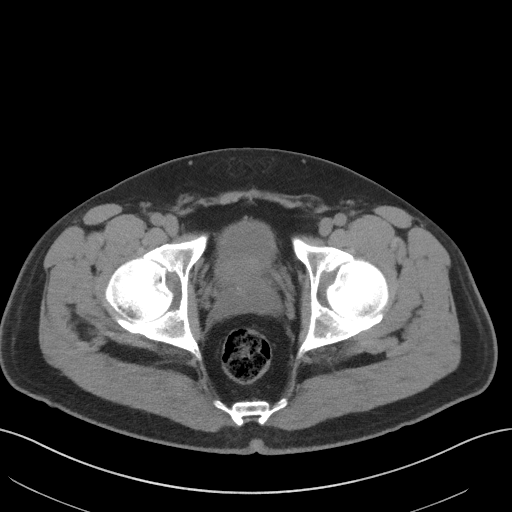
[im 29/104  soft-tissue]
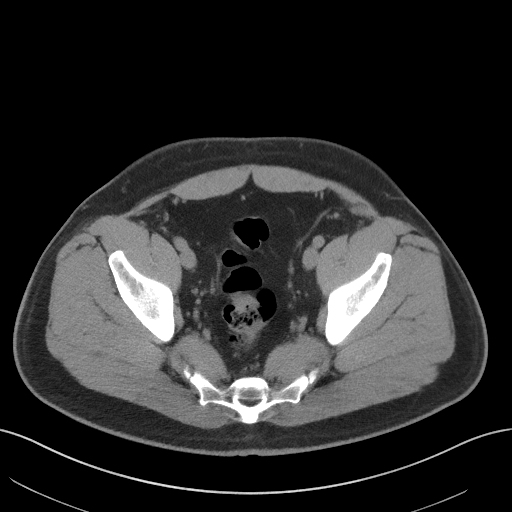
[im 38/104  soft-tissue]
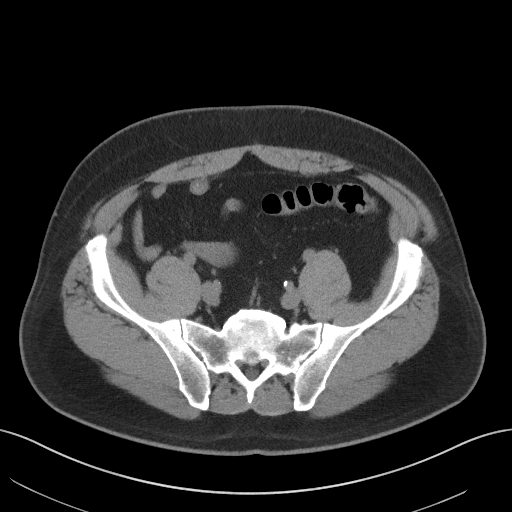
[im 46/104  soft-tissue]
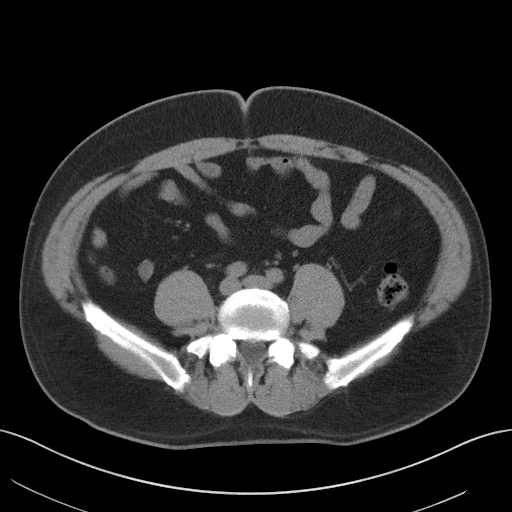
[im 54/104  soft-tissue]
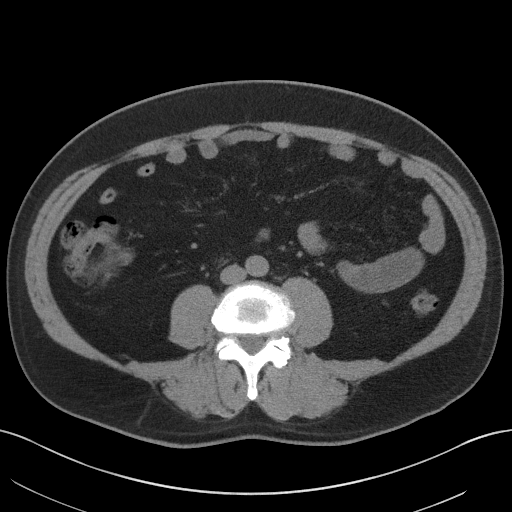
[im 58/104  soft-tissue]
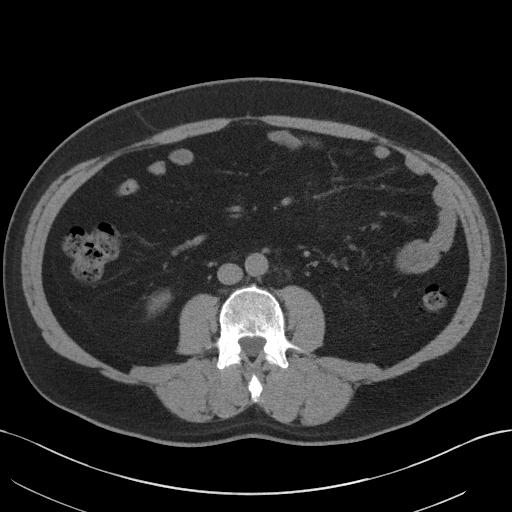
[im 66/104  soft-tissue]
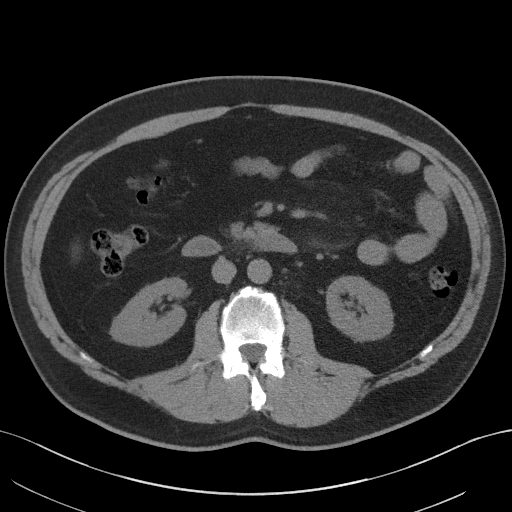
[im 66/104  bone]
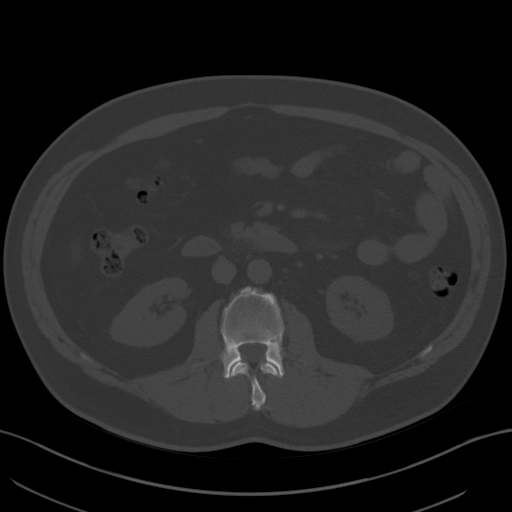
[im 75/104  soft-tissue]
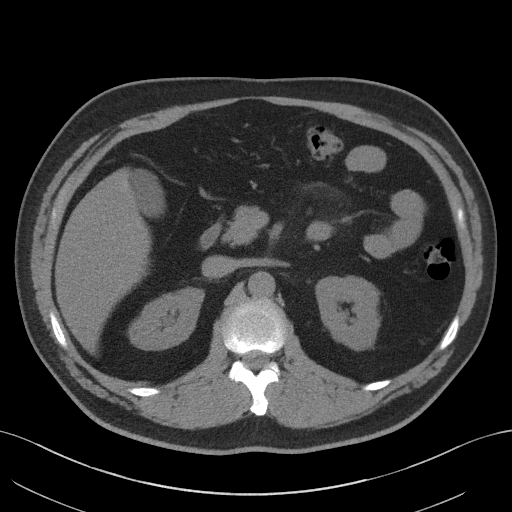
[im 83/104  soft-tissue]
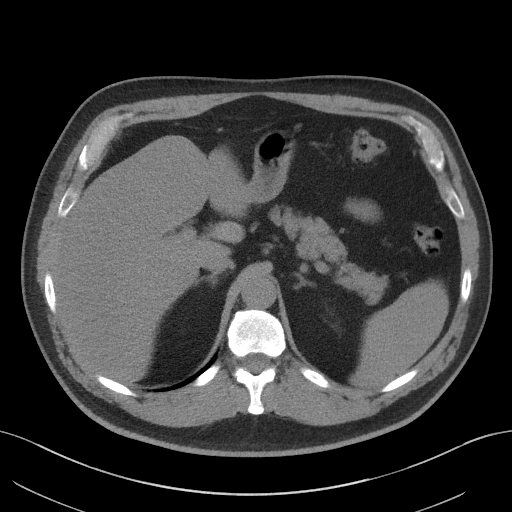
[im 91/104  soft-tissue]
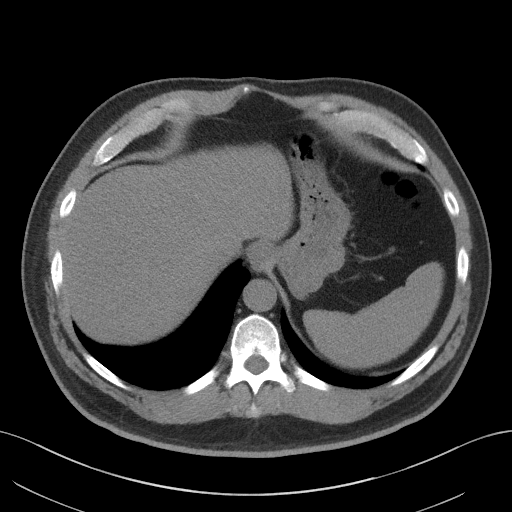
[im 99/104  soft-tissue]
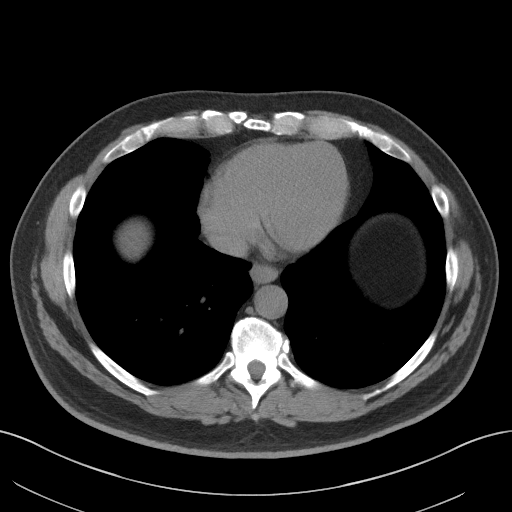

[Series 5: coronal st · coronal · 0.90mm/px · 3 of 101 slices shown]
[im 34/101  soft-tissue]
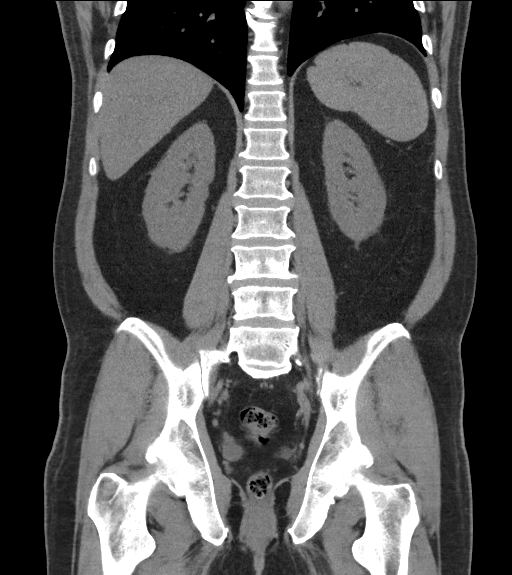
[im 45/101  soft-tissue]
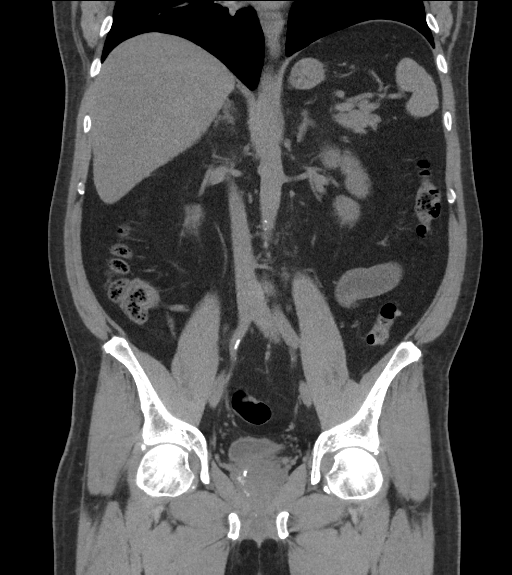
[im 56/101  soft-tissue]
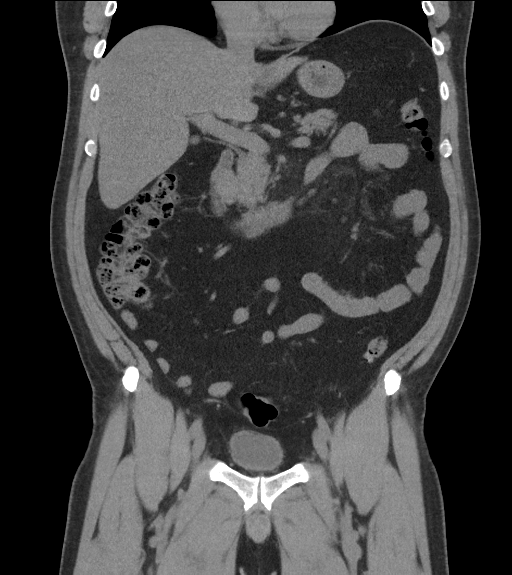

[16 of 46 positions shown; findings below may reference images not displayed]

FINDINGS: Lower chest: No acute abnormality.

Hepatobiliary: Mild hepatic steatosis. No focal liver abnormality.
Gallbladder appears normal. No bile duct dilatation.

Pancreas: Unremarkable. No pancreatic ductal dilatation or
surrounding inflammatory changes.

Spleen: Normal in size without focal abnormality.

Adrenals/Urinary Tract: Normal adrenal glands. No kidney stone,
mass, or hydronephrosis identified bilaterally. No hydroureter or
ureteral lithiasis. Urinary bladder is unremarkable.

Stomach/Bowel: There is circumferential wall thickening involving
the distal esophagus, image [DATE]. This is a nonspecific finding and
may reflect underlying esophagitis. Stomach is nondistended. The
appendix is visualized and appears normal. No signs small bowel wall
thickening, inflammation or distension. Scattered colonic
diverticula are noted without signs of acute diverticulitis.

Vascular/Lymphatic: Aortic atherosclerosis. Focal area of soft
tissue stranding within the jejunal mesentery is identified, image
34/2. No small bowel, retroperitoneal or pelvic adenopathy.

Reproductive: Prostate gland enlargement.

Other: No free fluid or fluid collections.

Musculoskeletal: No acute or significant osseous findings.
IMPRESSION: 1. No acute findings within the abdomen or pelvis. There are
scattered colonic diverticula without signs of acute diverticulitis.
2. Soft tissue stranding identified within the jejunal mesentery.
This is often referred to as misty mesentery. Although this may be
associated with metastatic disease and lymphoproliferative disorder,
in the absence of concomitant adenopathy this is considered a benign
nonspecific finding.
3. Mild hepatic steatosis.
4. Prostate gland enlargement.
5. Aortic Atherosclerosis ([id number]).
